# Patient Record
Sex: Male | Born: 1940
Health system: Southern US, Community
[De-identification: ages and names within clinical notes are randomized; demographics above are authoritative.]

## PROBLEM LIST (undated history)

## (undated) DIAGNOSIS — Z5189 Encounter for other specified aftercare: Secondary | ICD-10-CM

## (undated) DIAGNOSIS — I10 Essential (primary) hypertension: Secondary | ICD-10-CM

## (undated) DIAGNOSIS — K922 Gastrointestinal hemorrhage, unspecified: Secondary | ICD-10-CM

## (undated) DIAGNOSIS — IMO0001 Reserved for inherently not codable concepts without codable children: Secondary | ICD-10-CM

## (undated) DIAGNOSIS — J439 Emphysema, unspecified: Secondary | ICD-10-CM

## (undated) DIAGNOSIS — J189 Pneumonia, unspecified organism: Secondary | ICD-10-CM

## (undated) DIAGNOSIS — K5792 Diverticulitis of intestine, part unspecified, without perforation or abscess without bleeding: Secondary | ICD-10-CM

## (undated) DIAGNOSIS — C61 Malignant neoplasm of prostate: Secondary | ICD-10-CM

## (undated) DIAGNOSIS — T4145XA Adverse effect of unspecified anesthetic, initial encounter: Secondary | ICD-10-CM

## (undated) DIAGNOSIS — T8859XA Other complications of anesthesia, initial encounter: Secondary | ICD-10-CM

## (undated) DIAGNOSIS — E785 Hyperlipidemia, unspecified: Secondary | ICD-10-CM

## (undated) DIAGNOSIS — I4891 Unspecified atrial fibrillation: Secondary | ICD-10-CM

## (undated) HISTORY — DX: Reserved for inherently not codable concepts without codable children: IMO0001

## (undated) HISTORY — DX: Encounter for other specified aftercare: Z51.89

## (undated) HISTORY — DX: Gastrointestinal hemorrhage, unspecified: K92.2

## (undated) HISTORY — PX: TESTICLE REMOVAL: SHX68

## (undated) HISTORY — PX: CATARACT EXTRACTION: SUR2

## (undated) HISTORY — PX: OTHER SURGICAL HISTORY: SHX169

## (undated) HISTORY — DX: Unspecified atrial fibrillation: I48.91

## (undated) HISTORY — DX: Malignant neoplasm of prostate: C61

## (undated) HISTORY — DX: Pneumonia, unspecified organism: J18.9

## (undated) HISTORY — DX: Hyperlipidemia, unspecified: E78.5

## (undated) HISTORY — DX: Diverticulitis of intestine, part unspecified, without perforation or abscess without bleeding: K57.92

## (undated) HISTORY — DX: Essential (primary) hypertension: I10

## (undated) HISTORY — DX: Emphysema, unspecified: J43.9

## (undated) HISTORY — PX: PROSTATECTOMY: SHX69

---

## 1974-07-24 HISTORY — PX: TUMOR REMOVAL: SHX12

## 2000-02-14 ENCOUNTER — Ambulatory Visit (HOSPITAL_BASED_OUTPATIENT_CLINIC_OR_DEPARTMENT_OTHER): Admission: RE | Admit: 2000-02-14 | Discharge: 2000-02-14 | Payer: Self-pay | Admitting: General Surgery

## 2000-02-14 ENCOUNTER — Encounter (INDEPENDENT_AMBULATORY_CARE_PROVIDER_SITE_OTHER): Payer: Self-pay | Admitting: *Deleted

## 2001-05-22 ENCOUNTER — Encounter (INDEPENDENT_AMBULATORY_CARE_PROVIDER_SITE_OTHER): Payer: Self-pay | Admitting: *Deleted

## 2001-05-22 ENCOUNTER — Ambulatory Visit (HOSPITAL_COMMUNITY): Admission: RE | Admit: 2001-05-22 | Discharge: 2001-05-22 | Payer: Self-pay | Admitting: *Deleted

## 2001-10-30 ENCOUNTER — Encounter: Payer: Self-pay | Admitting: Urology

## 2001-11-01 ENCOUNTER — Ambulatory Visit (HOSPITAL_COMMUNITY): Admission: RE | Admit: 2001-11-01 | Discharge: 2001-11-01 | Payer: Self-pay | Admitting: Urology

## 2001-11-01 ENCOUNTER — Encounter (INDEPENDENT_AMBULATORY_CARE_PROVIDER_SITE_OTHER): Payer: Self-pay | Admitting: Specialist

## 2002-04-28 ENCOUNTER — Ambulatory Visit (HOSPITAL_BASED_OUTPATIENT_CLINIC_OR_DEPARTMENT_OTHER): Admission: RE | Admit: 2002-04-28 | Discharge: 2002-04-28 | Payer: Self-pay | Admitting: General Surgery

## 2002-04-28 ENCOUNTER — Encounter (INDEPENDENT_AMBULATORY_CARE_PROVIDER_SITE_OTHER): Payer: Self-pay | Admitting: Specialist

## 2002-06-27 ENCOUNTER — Ambulatory Visit (HOSPITAL_BASED_OUTPATIENT_CLINIC_OR_DEPARTMENT_OTHER): Admission: RE | Admit: 2002-06-27 | Discharge: 2002-06-27 | Payer: Self-pay | Admitting: General Surgery

## 2004-05-03 ENCOUNTER — Ambulatory Visit: Admission: RE | Admit: 2004-05-03 | Discharge: 2004-05-03 | Payer: Self-pay | Admitting: Internal Medicine

## 2009-07-19 ENCOUNTER — Emergency Department (HOSPITAL_COMMUNITY): Admission: EM | Admit: 2009-07-19 | Discharge: 2009-07-19 | Payer: Self-pay | Admitting: Emergency Medicine

## 2010-12-09 NOTE — Op Note (Signed)
[facility]  Patient:    IVERSON, SEES Visit Number: [id number] MRN: [id number]          Service Type: DSU Location: DAY Attending Physician:  Trisha Mangle Dictated by:   Veverly Fells Vernie Ammons, M.D. Proc. Date: 11/01/01 Admit Date:  11/01/2001                             Operative Report  PREOPERATIVE DIAGNOSIS:  Painful left spermatocele.  POSTOPERATIVE DIAGNOSIS:  Painful left spermatocele.  PROCEDURE:  Left spermatocelectomy.  SURGEON:  Mark C. Vernie Ammons, M.D.  ANESTHESIA:  Spinal.  ESTIMATED BLOOD LOSS:  Approximately 1 cc.  DRAINS:  None.  SPECIMENS:  Head of left epididymis with spermatocele.  COMPLICATIONS:  None.  INDICATIONS FOR PROCEDURE:  The patient is a 70 year old white male whose had a prior right orchiectomy in the past for benign disease. He presented to my office with a history of painful area in the left hemiscrotum and was found to have a painful swelling in the head of the epididymis. He has had a epididymal cyst denoted previously in this area which was asymptomatic at the time but has become symptomatic. It was noted to be 3 x 4 mm and simple cystic in appearance by ultrasound in September 1992. The area has now become tender but he has had no voiding symptoms nor has he had any fever or erythema in the area. We discussed conservative management versus an epididymectomy and partial epididymectomy and he would like to proceed with removal of this understanding the risks, complications and alternatives.  DESCRIPTION OF PROCEDURE:  After informed consent, the patient was brought to the major O.R., placed on the table, administered spinal anesthesia and then placed in the supine position. His genitalia was then sterilely prepped and draped and a midline median raphe scrotal incision was then made and carried down to open the parietal tunica. A small amount of clear amber fluid was removed and the testicle was then  delivered with the attached epididymis. Inspection revealed the testicle appeared to be normal. There appeared to be an adhesions of the parietal tunica to the testicle itself on the medial aspect. This was incised and the small resultant defect in the testicular tunica was then closed with a running 4-0 Vicryl suture. I was able to palpate the abnormal area in the head of the epididymis. I therefore isolated this and then first placed a 3-0 silk tie around the epididymis in the junction between the upper and middle thirds. It was totally ligated and then divided. I then incised the tissue at the junction between the epididymis and testicle circumferentially. Hemostats were then used to isolate the small vessels and tubules exiting the testicle in the head of the epididymis and these were then tied individually with 3-0 silk ties. This freed the upper portion of the epididymis which was then completely excised and sent to pathology.  I closed the two edges of parietal tunica with running locking 4-0 Vicryl suture. I then replaced the testicle in the tunica and closed that with a running locking 3-0 chromic suture. Prior to completion of the closure, I injected some 0.5% Marcaine around the testicle inside this ______ parietal tunica and closed that completely. I then injected further local anesthetic in the subscrotal tissue and closed the scrotal skin with running 3-0 chromic suture. Neosporin and sterile gauze dressing as well as fluffed 4 x 4s and a scrotal support  were then applied and the patient was taken to the recovery room in stable satisfactory condition. He had no intraoperative complications nor was there any time when he had any episodes of hypotension. He will be observed in the recovery room and discharged when fully recovered. He will be given a prescription for Tylox #40 and follow-up in my office in two weeks. Dictated by:   Veverly Fells Vernie Ammons, M.D. Attending Physician:   Trisha Mangle DD:  11/01/01 TD:  11/02/01 Job: [id number]

## 2010-12-09 NOTE — Procedures (Signed)
[place]  Patient:    Robert Hensley, Robert Hensley Visit Number: [id number] MRN: [id number]          Service Type: END Location: ENDO Attending Physician:  Sabino Gasser Dictated by:   Sabino Gasser, M.D. Proc. Date: 05/22/01 Admit Date:  05/22/2001                             Procedure Report  PROCEDURE PERFORMED:  Colonoscopy.  ENDOSCOPIST:  Sabino Gasser, M.D.  INDICATIONS FOR PROCEDURE:  Rectal bleeding.  ANESTHESIA:  Demerol 80 mg, Versed 8 mg.  DESCRIPTION OF PROCEDURE:  With the patient mildly sedated in the left lateral decubitus position, the Olympus videoscopic colonoscope was inserted in the rectum and passed under direct vision into the cecum.  The cecum was identified by the ileocecal valve and appendiceal orifice, both of which were photographed.  From this point, the colonoscope was slowly withdrawn, taking circumferential views of the entire colonic mucosa, stopping only then in the rectum which appeared normal on direct view and on retroflex view showed a small polyp just above the anal verge which was photographed and removed using hot biopsy forceps technique on a setting of 20/20 blended current.  The endoscope was straightened and withdrawn.  Patients vital signs and pulse oximeter remained stable.  The patient tolerated the procedure well and without apparent complications.  FINDINGS:  Polyp of rectum.  Otherwise unremarkable examination.  PLAN:  Await biopsy report.  Patient will call me for results and follow up with me as an outpatient. Dictated by:   Sabino Gasser, M.D. Attending Physician:  Sabino Gasser DD:  05/22/01 TD:  05/22/01 Job: [id number]

## 2010-12-09 NOTE — Op Note (Signed)
   NAME:  Robert Hensley, Robert Hensley NO.:  [id number]   MEDICAL RECORD NO.:  [id number]                   PATIENT TYPE:  AMB   LOCATION:  DSC                                  FACILITY:  [facility]   PHYSICIAN:  Gita Kudo, M.D.              DATE OF BIRTH:  1940-07-31   DATE OF PROCEDURE:  DATE OF DISCHARGE:                                 OPERATIVE REPORT   OPERATIVE PROCEDURE:  Re-excision, melanoma, chest.   SURGEON:  Dr. Maryagnes Amos.   ANESTHESIA:  MAC - IV sedation, local 1% Xylocaine.   PREOPERATIVE DIAGNOSES:  Melanoma in situ chest, approximately five weeks  post excision.   HISTORY OF PRESENT ILLNESS:  I excised a lesion on Mr. Santoli in November.  The area was melanoma in situ and a very close margin.  The wound has healed  and he comes in for re-excision, to get good margins.   OPERATIVE FINDINGS:  I measured out the operative site and it was  approximately 3 cm.  I then marked out 1 cm in all directions and converted  this into a transversely oriented elliptical incision.  The specimen was  marked with suture for pathology.   OPERATIVE PROCEDURE:  Under satisfactory intravenous sedation, the patient's  chest was prepped and draped in a standard fashion.  The area just to the  right of the sternum was infiltrated with Xylocaine with epinephrine for  good analgesia.  Then, the above described elliptical incision was made and  carried down through the fat, and the specimen dissected way using cautery  for both hemostasis and dissection.  Slight undermining was done in the  central portion of the wound to relieve tension, and then the wound was  closed under some tension with interrupted 3-0 and 2-0 nylon suture.  After  excision, the wound was checked for hemostasis which was good, and a  sterile, absorbant dressing applied.  He tolerated the procedure well, and  went to the recovery room from the operating room in good condition.                             Gita Kudo, M.D.    MRL/MEDQ  D:  06/27/2002  T:  06/27/2002  Job:  [id number]

## 2011-04-05 ENCOUNTER — Ambulatory Visit (HOSPITAL_COMMUNITY)
Admission: RE | Admit: 2011-04-05 | Discharge: 2011-04-05 | Disposition: A | Discharge status: Home / Self Care | Payer: Medicare Other | Source: Ambulatory Visit | Attending: Orthopedic Surgery | Admitting: Orthopedic Surgery

## 2011-04-05 ENCOUNTER — Other Ambulatory Visit (HOSPITAL_COMMUNITY): Payer: Self-pay | Admitting: Orthopedic Surgery

## 2011-04-05 ENCOUNTER — Encounter (HOSPITAL_COMMUNITY)
Admission: RE | Admit: 2011-04-05 | Discharge: 2011-04-05 | Disposition: A | Discharge status: Home / Self Care | Payer: Medicare Other | Source: Ambulatory Visit | Attending: Orthopedic Surgery | Admitting: Orthopedic Surgery

## 2011-04-05 DIAGNOSIS — Z01812 Encounter for preprocedural laboratory examination: Secondary | ICD-10-CM | POA: Insufficient documentation

## 2011-04-05 DIAGNOSIS — Z01818 Encounter for other preprocedural examination: Secondary | ICD-10-CM | POA: Insufficient documentation

## 2011-04-05 DIAGNOSIS — Z01811 Encounter for preprocedural respiratory examination: Secondary | ICD-10-CM

## 2011-04-05 DIAGNOSIS — Z77090 Contact with and (suspected) exposure to asbestos: Secondary | ICD-10-CM | POA: Insufficient documentation

## 2011-04-05 LAB — SURGICAL PCR SCREEN
MRSA, PCR: NEGATIVE
Staphylococcus aureus: NEGATIVE

## 2011-04-05 LAB — BASIC METABOLIC PANEL
CO2: 31 mEq/L (ref 19–32)
Calcium: 10.6 mg/dL — ABNORMAL HIGH (ref 8.4–10.5)
Chloride: 99 mEq/L (ref 96–112)
Potassium: 4.8 mEq/L (ref 3.5–5.1)
Sodium: 138 mEq/L (ref 135–145)

## 2011-04-05 LAB — CBC
HCT: 42.8 % (ref 39.0–52.0)
Hemoglobin: 15.5 g/dL (ref 13.0–17.0)
RBC: 4.96 MIL/uL (ref 4.22–5.81)
RDW: 13.1 % (ref 11.5–15.5)
WBC: 9.4 10*3/uL (ref 4.0–10.5)

## 2011-04-05 LAB — PROTIME-INR: INR: 0.94 (ref 0.00–1.49)

## 2011-04-07 ENCOUNTER — Other Ambulatory Visit: Payer: Self-pay | Admitting: Orthopedic Surgery

## 2011-04-07 DIAGNOSIS — R9389 Abnormal findings on diagnostic imaging of other specified body structures: Secondary | ICD-10-CM

## 2011-04-10 ENCOUNTER — Ambulatory Visit
Admission: RE | Admit: 2011-04-10 | Discharge: 2011-04-10 | Disposition: A | Discharge status: Home / Self Care | Payer: Medicare Other | Source: Ambulatory Visit | Attending: Orthopedic Surgery | Admitting: Orthopedic Surgery

## 2011-04-10 DIAGNOSIS — R9389 Abnormal findings on diagnostic imaging of other specified body structures: Secondary | ICD-10-CM

## 2011-04-10 MED ORDER — IOHEXOL 300 MG/ML  SOLN
75.0000 mL | Freq: Once | INTRAMUSCULAR | Status: AC | PRN
  Administered 2011-04-10: 75 mL via INTRAVENOUS

## 2011-04-18 ENCOUNTER — Other Ambulatory Visit: Payer: Self-pay | Admitting: Orthopedic Surgery

## 2011-04-18 ENCOUNTER — Ambulatory Visit (HOSPITAL_COMMUNITY)
Admission: RE | Admit: 2011-04-18 | Discharge: 2011-04-20 | Disposition: A | Discharge status: Home / Self Care | Payer: Medicare Other | Source: Ambulatory Visit | Attending: Orthopedic Surgery | Admitting: Orthopedic Surgery

## 2011-04-18 DIAGNOSIS — J449 Chronic obstructive pulmonary disease, unspecified: Secondary | ICD-10-CM | POA: Insufficient documentation

## 2011-04-18 DIAGNOSIS — M23305 Other meniscus derangements, unspecified medial meniscus, unspecified knee: Secondary | ICD-10-CM | POA: Insufficient documentation

## 2011-04-18 DIAGNOSIS — E669 Obesity, unspecified: Secondary | ICD-10-CM | POA: Insufficient documentation

## 2011-04-18 DIAGNOSIS — M6740 Ganglion, unspecified site: Secondary | ICD-10-CM | POA: Insufficient documentation

## 2011-04-18 DIAGNOSIS — Z01812 Encounter for preprocedural laboratory examination: Secondary | ICD-10-CM | POA: Insufficient documentation

## 2011-04-18 DIAGNOSIS — I10 Essential (primary) hypertension: Secondary | ICD-10-CM | POA: Insufficient documentation

## 2011-04-18 DIAGNOSIS — M23302 Other meniscus derangements, unspecified lateral meniscus, unspecified knee: Secondary | ICD-10-CM | POA: Insufficient documentation

## 2011-04-18 DIAGNOSIS — J4489 Other specified chronic obstructive pulmonary disease: Secondary | ICD-10-CM | POA: Insufficient documentation

## 2011-04-18 DIAGNOSIS — Z01818 Encounter for other preprocedural examination: Secondary | ICD-10-CM | POA: Insufficient documentation

## 2011-04-18 DIAGNOSIS — Z23 Encounter for immunization: Secondary | ICD-10-CM | POA: Insufficient documentation

## 2011-04-19 LAB — COMPREHENSIVE METABOLIC PANEL
ALT: 16 U/L (ref 0–53)
Albumin: 3.6 g/dL (ref 3.5–5.2)
Alkaline Phosphatase: 70 U/L (ref 39–117)
Alkaline Phosphatase: 74 U/L (ref 39–117)
BUN: 14 mg/dL (ref 6–23)
CO2: 31 mEq/L (ref 19–32)
Chloride: 102 mEq/L (ref 96–112)
GFR calc Af Amer: >60 mL/min (ref >60)
GFR calc Af Amer: >60 mL/min (ref >60)
Glucose, Bld: 121 mg/dL — ABNORMAL HIGH (ref 70–99)
Glucose, Bld: 131 mg/dL — ABNORMAL HIGH (ref 70–99)
Potassium: 4.2 mEq/L (ref 3.5–5.1)
Potassium: 4.3 mEq/L (ref 3.5–5.1)
Sodium: 137 mEq/L (ref 135–145)
Total Bilirubin: 0.5 mg/dL (ref 0.3–1.2)
Total Protein: 6.9 g/dL (ref 6.0–8.3)

## 2011-04-19 LAB — CARDIAC PANEL(CRET KIN+CKTOT+MB+TROPI)
Relative Index: INVALID (ref 0.0–2.5)
Relative Index: INVALID (ref 0.0–2.5)

## 2011-04-19 LAB — CBC
HCT: 39.8 % (ref 39.0–52.0)
Hemoglobin: 13.6 g/dL (ref 13.0–17.0)
Hemoglobin: 14.5 g/dL (ref 13.0–17.0)
MCHC: 34.8 g/dL (ref 30.0–36.0)
WBC: 12.1 10*3/uL — ABNORMAL HIGH (ref 4.0–10.5)
WBC: 15.8 10*3/uL — ABNORMAL HIGH (ref 4.0–10.5)

## 2011-04-19 LAB — TROPONIN I: Troponin I: <0.3 ng/mL (ref <0.30)

## 2011-04-20 LAB — CARDIAC PANEL(CRET KIN+CKTOT+MB+TROPI)
CK, MB: 2.2 ng/mL (ref 0.3–4.0)
Relative Index: INVALID (ref 0.0–2.5)
Troponin I: <0.3 ng/mL (ref <0.30)
Troponin I: <0.3 ng/mL (ref <0.30)

## 2011-04-24 NOTE — Consult Note (Signed)
NAME:  Robert Hensley, Robert Hensley NO.:  [id number]  MEDICAL RECORD NO.:  [id number]  LOCATION:  5008                         FACILITY:  [facility]  PHYSICIAN:  Pleas Koch, MD        DATE OF BIRTH:  [birth year]  DATE OF CONSULTATION: DATE OF DISCHARGE:                                CONSULTATION   REFERRING PHYSICIAN:  Burnard Bunting, MD  REASON FOR CONSULTATION:  Syncope.  HISTORY OF PRESENT ILLNESS:  Mr. Robert Hensley is a very pleasant Caucasian male with history mainly of hypertension who presented to [facility] for right knee lateral meniscus tear and cyst that was repaired by diagnostic arthroscopy yesterday by Dr. August Saucer.  He was getting up today at bedside with the sister and went near the door and sat on the side of the table when he felt he was about to pass out.  He denies any blurred vision or double vision prior, but did feel a sensation of presyncope and sensation of dizziness and felt that he was about to fall.  He thought himself quickly that he should get back to the bed, however, was not able to make it and asked the sister who is in the room and observe this to get a glass of cold water.  Subsequently, he got to the ground in a half fall onto his left side.  Please note that his arthroscopy was on the right knee.  The patient was unresponsive for a couple of seconds and blood pressures that were done initially when Rapid Response was called showed systolic blood pressure of 90/40 with a heart rate of 45 and blood pressure recycle showing blood pressure of 103/37, pulse rate of 55, subsequent blood pressure 115/60 and heart rate 61.  The patient was sat up from the floor but became diaphoretic and his blood pressure was 125/64 and 63.  His blood pressure subsequently came to the normal range.  He was given 1/2 liter bolus of normal saline.  When I got to the bedside, he was kept on telemetry and his heart rate was in the 60s, normal sinus rhythm on  lead II of the monitor.  He was fully oriented and knew that he had a fall, he knew where he was.  He denied any specific chest pain, any real shortness of breath at that time.  He also denied any specific headache or any tingling in his arms or any urinary or bowel incontinence at that time or any seizure-like activity.  The patient then sat up and the patient is actually fully oriented right now and is able to talk to me without any slurred speech or any obvious focal deficit.  The patient moves all four limbs equally and has no real weakness in any of his limbs per my cursory exam initially, his sister and him given the report.  The patient states that he has had this type of issue in the past when he had a prior surgery.  He actually did have a vasovagal syncope after the operation as he got queasy and has a weak constitution per him.  The patient also thinks that his Maxzide may  play a role in his having this vasovagal/orthostatic event given the fact that he seems to have this every time he takes his Maxzide.  The patient admits to taking all of his chronic blood pressure medications religiously and has not had much problem except with may be a little bit of the Maxzide that he is on.  PAST MEDICAL HISTORY:  He carries a diagnosis of hypertension which is relatively well controlled, hyperlipidemia.  He is relatively obese with a BMI of above 30.  Please note that the patient also took Percocet this morning and was nonambulatory after operation to 5-6 o'clock this morning and then got up once again which is a period of time where he had the syncope.  The patient also carries a history of GI bleed per colonoscopy report on May 22, 2001, and was told never to take aspirin again.  The path report from those findings show benign colonic glands with serrated architecture consistent with hyperplastic polyp and no evidence of malignancy.  He has also had benign spermatocele tail  of epididymis.  He has had dysplastic nevi and a history of malignant melanoma in situ, numerous times operated upon in October and December 2003.  FAMILY HISTORY:  He has relatives who had strokes in their old age.  He has no one who has had any real heart attacks, however.  PHYSICAL EXAMINATION:  GENERAL:  The patient is pleasant, alert, oriented Caucasian male lying in bed.  He is able to track my finger equally in all four quadrants. HEENT:  Vision by direct confrontation reveals all quadrants are intact. NECK:  Soft, supple.  He has no carotid bruit. HEART:  He has a faint murmur at left lower sternal edge. LUNGS:  His chest is completely clinically clear, although he tells me he has had a history of emphysema in the past. ABDOMEN:  Soft, nontender, nondistended, and obese and he has a midline lower abdominal scar. EXTREMITIES:  Soft, nontender.  His right knee is wrapped and has one or two spots of blood on it. NEUROLOGIC:  He has 5/5 power and is very strong in his flexors and extensors of his biceps, triceps, and of his arms.  He has grade 5/5 power of his left hip.  His right knee has mild limitation of power secondary to habitus.  His reflexes are intact bilaterally and he has 2 or 3 reflexes.  Sensation is grossly intact.  His gait was assessed. The patient was able to stand and ambulate without real event.  LABORATORY DATA:  Orthostatic vital signs that were done revealed sitting blood pressure 158/71 with pulse of 60, lying blood pressure 164/60 with pulse 58, and standing blood pressure 137/67 with pulse of 71.  Again, on tele he is 66 beats per minute.  An EKG was done which showed left anterior fascicular block.  PR interval of 0.08.  R-wave progression V3 through V4.  I do not appreciate any Q waves.  There is no gross T-wave inversion and this is very poor quality EKG.  Labs are pending that we have ordered in room were CBC with diff, CMET, and cardiac  panel.  IMPRESSION/ASSESSMENT:  This is a 70 year old male with likely vasovagal syncope versus orthostatic hypotension, likely related to him having high vagal tone but also could be due to multiple blood pressure medications that he is on.  At present time, I would recommend cutting back his dose of metoprolol succinate to 12.5 daily given this is a nodal agent  and continuing him on his Maxzide and cutting his amlodipine back to 5 mg and as also has AV nodal properties.  If the patient does not have further recurrence of the same, I feel it would be reasonable to hold off on carotid ultrasound and a CT scan of the head as I do not think he had a stroke or a seizure as he is totally nonfocal.  With regards to further protection in the near future, I would recommend that he start on aspirin 81 mg daily with PPI coverage despite his prior history of colonic bleed simply given the fact that he has metabolic syndrome by criterion of weight and BMI the fact that he is hypertensive and he has hyperlipidemia and this will prevent all caused mortality from stroke or myocardial infarction.  Thank you for this interesting consult.  We await further labs.  If his troponins which we have ordered come back positive, we will obviously consult Cardiology.  Otherwise if the patient is totally asymptomatic in the morning and his labs are normal, he can safely be discharged home from a medicine standpoint.  I appreciate Dr. Lorin Picket Dean's referral of this patient -- if need be please call as but we will sign off if his labs and other workup are normal.          ______________________________ Pleas Koch, MD     JS/MEDQ  D:  04/19/2011  T:  04/19/2011  Job:  [id number]  Electronically Signed by Pleas Koch MD on 04/24/2011 04:25:49 PM

## 2011-05-03 NOTE — Op Note (Signed)
  NAME:  Robert Hensley, PIZZO NO.:  [id number]  MEDICAL RECORD NO.:  [id number]  LOCATION:  SDSC                         FACILITY:  [facility]  PHYSICIAN:  Burnard Bunting, M.D.    DATE OF BIRTH:  01-31-1941  DATE OF PROCEDURE:  04/18/2011 DATE OF DISCHARGE:                              OPERATIVE REPORT   PREOPERATIVE DIAGNOSIS:  Right knee lateral meniscal tear and cyst.  POSTOPERATIVE DIAGNOSIS:  Right knee lateral meniscal tear, medial meniscal tear, and lateral meniscal cyst.  PROCEDURE:  Right knee diagnostic arthroscopy, partial medial lateral meniscectomy, open excision of meniscal cyst.  SURGEON:  Burnard Bunting, M.D.  ASSISTANT:  None.  ANESTHESIA:  Spinal.  INDICATIONS:  [name] is a 70 year old patient with right knee pain.  MRI is consistent with lateral meniscal tear and a meniscal cyst presents now for operative management.  PROCEDURE IN DETAIL:  The patient was brought to operating room where spinal anesthetic was induced.  Right leg time-out was called.  Right knee area was __________with alcohol and Betadine and dry prepped DuraPrep solution and draped in sterile manner.  After anesthesia was covered the operative field, Tourniquet was not used, anterior inferolateral portals established.  Anterior-inferior medial forceps established under direct visualization, diagnostic arthroscopy was performed.  The patient had intact patellofemoral compartment.  On the medial side, it did have a degenerative radial tear of the medial meniscus involving 30% anterior-posterior width, this was debrided back to stable rim.  He did have some early grade 1 chondromalacia changes on the medial femoral condyle.  ACL, PCL intact.  The patient had grade 3 changes on the lateral tibial plateau and lateral femoral condyle with a pair of the anterior horn lateral meniscus, unstable flaps, horizontal cleavage type, this was debrided back to stable rims with a combination of  basket punch and shavers.  About 50% to 60% anterior-posterior width of the meniscus was involved.  Following meniscal debridement, knee joint was thoroughly irrigated.  Instruments were removed.  Then, incision was then made over the meniscal cyst which was anterior- lateral.  Following this, a longitudinal incision was made over the cyst.  Skin tissues were sharply divided.  Iliotibial band was partially divided.  Cyst was visualized filled with the gelatinous fluid, it was excised off the lateral meniscal rim.  The inferior genicular artery was encountered and coagulated.  The cyst was excised and sent to pathology. The cyst stalk was cauterized.  At this time, the incision was thoroughly irrigated and closed using interrupted inverted 0 Vicryl suture 2-0, Vicryl suture and 3-0 Prolene.  Steri-Strips were applied.  Portals were closed using 3-0 nylon.  Solution of Marcaine, morphine finally injected to the knee.  The patient tolerated the procedure well without immediate complications. __________.  Bulky dressing and knee immobilizer was applied.     Burnard Bunting, M.D.     GSD/MEDQ  D:  04/18/2011  T:  04/18/2011  Job:  [id number]  Electronically Signed by Reece Agar.  Marion Rosenberry M.D. on 05/03/2011 04:54:09 PM

## 2011-07-26 ENCOUNTER — Other Ambulatory Visit: Payer: Self-pay | Admitting: Internal Medicine

## 2011-07-26 DIAGNOSIS — J948 Other specified pleural conditions: Secondary | ICD-10-CM

## 2011-09-18 ENCOUNTER — Ambulatory Visit
Admission: RE | Admit: 2011-09-18 | Discharge: 2011-09-18 | Disposition: A | Discharge status: Home / Self Care | Payer: Medicare Other | Source: Ambulatory Visit | Attending: Internal Medicine | Admitting: Internal Medicine

## 2011-09-18 DIAGNOSIS — J948 Other specified pleural conditions: Secondary | ICD-10-CM

## 2011-09-18 MED ORDER — IOHEXOL 300 MG/ML  SOLN
75.0000 mL | Freq: Once | INTRAMUSCULAR | Status: AC | PRN
  Administered 2011-09-18: 75 mL via INTRAVENOUS

## 2011-10-26 ENCOUNTER — Encounter: Payer: Self-pay | Admitting: Internal Medicine

## 2011-10-31 ENCOUNTER — Encounter: Payer: Self-pay | Admitting: Internal Medicine

## 2011-11-01 ENCOUNTER — Encounter: Payer: Self-pay | Admitting: Internal Medicine

## 2011-11-01 ENCOUNTER — Ambulatory Visit (INDEPENDENT_AMBULATORY_CARE_PROVIDER_SITE_OTHER): Payer: Medicare Other | Admitting: Internal Medicine

## 2011-11-01 ENCOUNTER — Other Ambulatory Visit (INDEPENDENT_AMBULATORY_CARE_PROVIDER_SITE_OTHER): Payer: Medicare Other

## 2011-11-01 VITALS — BP 148/64 | HR 80 | Ht 74.0 in | Wt 256.0 lb

## 2011-11-01 DIAGNOSIS — Z1211 Encounter for screening for malignant neoplasm of colon: Secondary | ICD-10-CM

## 2011-11-01 DIAGNOSIS — R1031 Right lower quadrant pain: Secondary | ICD-10-CM

## 2011-11-01 DIAGNOSIS — E7800 Pure hypercholesterolemia, unspecified: Secondary | ICD-10-CM | POA: Insufficient documentation

## 2011-11-01 DIAGNOSIS — I10 Essential (primary) hypertension: Secondary | ICD-10-CM | POA: Insufficient documentation

## 2011-11-01 DIAGNOSIS — J449 Chronic obstructive pulmonary disease, unspecified: Secondary | ICD-10-CM | POA: Insufficient documentation

## 2011-11-01 LAB — CBC WITH DIFFERENTIAL/PLATELET
Basophils Absolute: 0 10*3/uL (ref 0.0–0.1)
Eosinophils Absolute: 0.1 10*3/uL (ref 0.0–0.7)
HCT: 42.8 % (ref 39.0–52.0)
Hemoglobin: 14.6 g/dL (ref 13.0–17.0)
Lymphocytes Relative: 21.9 % (ref 12.0–46.0)
Lymphs Abs: 1.9 10*3/uL (ref 0.7–4.0)
MCHC: 34.1 g/dL (ref 30.0–36.0)
Monocytes Absolute: 0.5 10*3/uL (ref 0.1–1.0)
Neutro Abs: 6 10*3/uL (ref 1.4–7.7)
RDW: 13.8 % (ref 11.5–14.6)

## 2011-11-01 LAB — COMPREHENSIVE METABOLIC PANEL
ALT: 43 U/L (ref 0–53)
AST: 31 U/L (ref 0–37)
Alkaline Phosphatase: 68 U/L (ref 39–117)
Creatinine, Ser: 0.8 mg/dL (ref 0.4–1.5)
Total Bilirubin: 0.5 mg/dL (ref 0.3–1.2)

## 2011-11-01 MED ORDER — PEG-KCL-NACL-NASULF-NA ASC-C 100 G PO SOLR: 1.0000 | Freq: Once | ORAL | Status: DC

## 2011-11-01 NOTE — Patient Instructions (Addendum)
You have been scheduled for a colonoscopy with propofol. Please follow written instructions given to you at your visit today.  Please pick up your prep kit at the pharmacy within the next 1-3 days.  You have been scheduled for a CT on 11/03/2011 at 10:30, @ [facility].   Please drink one bottle of your contrast at 8:30am The second bottle of contrast at 9:30am   Your physician has requested that you go to the basement for  lab work before leaving today.

## 2011-11-01 NOTE — Progress Notes (Signed)
Subjective:    Patient ID: Robert Hensley, male    DOB: June 12, 1941, 71 y.o.   MRN: [id number]  HPI Robert Hensley is a 71 year old male with a past medical history of hypertension, hyperlipidemia, COPD who is seen in consultation at the request of Dr. Renne Crigler for evaluation of right lower quadrant abdominal pain. The patient states he's had goal and somewhat intermittent right lower corner abdominal pain since January 2013. Now this pain is rather constant, and he cannot relate this pain to anything specific. He does not seem to get better or worse with eating, bowel movement, movement. He reports being able to work outside yesterday on his lawn and garden without additional pain. His bowel habits have been a bit more loose for him over the last few months, he said no blood or melena. He rates abdominal pain 3-4/10 at worst, and now about a 1/10.  He was treated recently with metronidazole for 7 days, and at the end of 7 days ciprofloxacin was added with metronidazole for additional week. He completed his therapy for 3 days ago. Initially he reports significant improvement in his abdominal pain, but it did not completely resolved with antibiotics. He is noted no fevers or chills. Appetite is good. No weight loss. No other abdominal pain, specifically no epigastric pain. No heartburn. No nausea or vomiting. No dysphagia or odynophagia.   Review of Systems As per HPI, otherwise neg  Past Medical History  Diagnosis Date  . Prostate cancer   . Emphysematous COPD   . Hyperlipidemia   . Hypertension   . Pneumonia   . Diverticulitis    Current Outpatient Prescriptions  Medication Sig Dispense Refill  . amLODipine-benazepril (LOTREL) 10-40 MG per capsule Take 1 capsule by mouth daily.      . Ascorbic Acid (VITAMIN C) 100 MG tablet Take 100 mg by mouth daily.      Marland Kitchen atorvastatin (LIPITOR) 20 MG tablet Take 20 mg by mouth daily.      . diphenhydrAMINE (BENADRYL) 25 mg capsule Take 25 mg by mouth as needed.       . Melatonin 5 MG CAPS Take 1 capsule by mouth as needed.      . metoprolol tartrate (LOPRESSOR) 25 MG tablet Take 25 mg by mouth daily.      . Multiple Vitamin (MULTIVITAMIN) tablet Take 1 tablet by mouth daily.      . Omega-3 Fatty Acids (FISH OIL) 1200 MG CAPS Take 1 capsule by mouth daily.      Marland Kitchen triamterene-hydrochlorothiazide (DYAZIDE) 37.5-25 MG per capsule Take 1 capsule by mouth daily.      . peg 3350 powder (MOVIPREP) SOLR Take 1 kit (100 g total) by mouth once.  1 kit  0   Allergies  Allergen Reactions  . Penicillins    Family History  Problem Relation Age of Onset  . Liver cancer Sister    History  Substance Use Topics  . Smoking status: Former Smoker    Quit date: 09/22/1974  . Smokeless tobacco: Former Neurosurgeon    Types: Chew    Quit date: 07/24/1994  . Alcohol Use: No        Objective:   Physical Exam BP 148/64  Pulse 80  Ht 6\' 2"  (1.88 m)  Wt 256 lb (116.121 kg)  BMI 32.87 kg/m2 Constitutional: Well-developed and well-nourished. No distress. HEENT: Normocephalic and atraumatic. Oropharynx is clear and moist. No oropharyngeal exudate. Conjunctivae are normal. Pupils are equal round and reactive to light. No  scleral icterus. Neck: Neck supple. Trachea midline. Cardiovascular: Normal rate, regular rhythm and intact distal pulses. No M/R/G Pulmonary/chest: Effort normal and breath sounds normal. No wheezing, rales or rhonchi. Abdominal: Soft, very mild right lower quadrant abdominal pain to deep palpation without rebound or guarding, nondistended. Bowel sounds active throughout. There are no masses palpable. No hepatosplenomegaly. Extremities: no clubbing, cyanosis, or edema Lymphadenopathy: No cervical adenopathy noted. Neurological: Alert and oriented to person place and time. Skin: Skin is warm and dry. No rashes noted. Psychiatric: Normal mood and affect. Behavior is normal.  CBC    Component Value Date/Time   WBC 8.5 11/01/2011 1126   RBC 4.80 11/01/2011  1126   HGB 14.6 11/01/2011 1126   HCT 42.8 11/01/2011 1126   PLT 263.0 11/01/2011 1126   MCV 89.1 11/01/2011 1126   MCH 31.5 04/19/2011 1902   MCHC 34.1 11/01/2011 1126   RDW 13.8 11/01/2011 1126   LYMPHSABS 1.9 11/01/2011 1126   MONOABS 0.5 11/01/2011 1126   EOSABS 0.1 11/01/2011 1126   BASOSABS 0.0 11/01/2011 1126   CMP     Component Value Date/Time   NA 137 11/01/2011 1126   K 4.7 11/01/2011 1126   CL 100 11/01/2011 1126   CO2 29 11/01/2011 1126   GLUCOSE 105* 11/01/2011 1126   BUN 14 11/01/2011 1126   CREATININE 0.8 11/01/2011 1126   CALCIUM 9.7 11/01/2011 1126   PROT 8.1 11/01/2011 1126   ALBUMIN 4.6 11/01/2011 1126   AST 31 11/01/2011 1126   ALT 43 11/01/2011 1126   ALKPHOS 68 11/01/2011 1126   BILITOT 0.5 11/01/2011 1126   GFRNONAA >60 04/19/2011 1902   GFRAA >60 04/19/2011 1902       Assessment & Plan:  71 year old male with a past medical history of hypertension, hyperlipidemia, COPD who is seen in consultation at the request of Dr. Renne Crigler for evaluation of right lower quadrant abdominal pain.  1. RLQ abd pain -- the patient's right lower corner pain did seem to respond, though incompletely to oral antibiotics. This raises the possibility of diverticulitis. Given that it did not resolve completely with what would be expected to be adequate treatment, I recommend an abdominal CT scan. If this is unremarkable, then I recommended colonoscopy. We discussed the risks and benefits of colonoscopy, and he is agreeable to proceed. He does report having had a previous colonoscopy but greater than 10 years ago.  Further recommendations after imaging and procedure

## 2011-11-03 ENCOUNTER — Other Ambulatory Visit (HOSPITAL_COMMUNITY): Payer: Medicare Other

## 2011-11-03 ENCOUNTER — Ambulatory Visit (HOSPITAL_COMMUNITY)
Admission: RE | Admit: 2011-11-03 | Discharge: 2011-11-03 | Disposition: A | Discharge status: Home / Self Care | Payer: Medicare Other | Source: Ambulatory Visit | Attending: Internal Medicine | Admitting: Internal Medicine

## 2011-11-03 DIAGNOSIS — R1031 Right lower quadrant pain: Secondary | ICD-10-CM | POA: Insufficient documentation

## 2011-11-03 DIAGNOSIS — Z1211 Encounter for screening for malignant neoplasm of colon: Secondary | ICD-10-CM

## 2011-11-03 DIAGNOSIS — Z8546 Personal history of malignant neoplasm of prostate: Secondary | ICD-10-CM | POA: Insufficient documentation

## 2011-11-03 MED ORDER — IOHEXOL 300 MG/ML  SOLN
100.0000 mL | Freq: Once | INTRAMUSCULAR | Status: AC | PRN
  Administered 2011-11-03: 100 mL via INTRAVENOUS

## 2011-11-06 ENCOUNTER — Ambulatory Visit (AMBULATORY_SURGERY_CENTER): Payer: Medicare Other | Admitting: Internal Medicine

## 2011-11-06 ENCOUNTER — Encounter: Payer: Self-pay | Admitting: Internal Medicine

## 2011-11-06 VITALS — BP 160/73 | HR 84 | Temp 98.1°F | Resp 17 | Ht 74.0 in | Wt 256.0 lb

## 2011-11-06 DIAGNOSIS — D126 Benign neoplasm of colon, unspecified: Secondary | ICD-10-CM

## 2011-11-06 DIAGNOSIS — R1031 Right lower quadrant pain: Secondary | ICD-10-CM

## 2011-11-06 DIAGNOSIS — Z1211 Encounter for screening for malignant neoplasm of colon: Secondary | ICD-10-CM

## 2011-11-06 MED ORDER — SODIUM CHLORIDE 0.9 % IV SOLN: 500.0000 mL | INTRAVENOUS | Status: DC

## 2011-11-06 NOTE — Op Note (Signed)
[facility] [address] [place], [place]  [address]  COLONOSCOPY PROCEDURE REPORT  PATIENT:  Robert Hensley, Robert Hensley  MR#:  [id number] BIRTHDATE:  [birth year], 70 yrs. old  GENDER:  male ENDOSCOPIST:  Carie Caddy. Bellamy Rubey, MD REF. BY:  Soyla Murphy. Renne Crigler, M.D. PROCEDURE DATE:  11/06/2011 PROCEDURE:  Colonoscopy with snare polypectomy ASA CLASS:  Class III INDICATIONS:  RLQ abdominal pain (s/p empiric treatment for diverticulitis), Routine Risk Screening MEDICATIONS:   MAC sedation, administered by CRNA, propofol (Diprivan) 350 mg IV  DESCRIPTION OF PROCEDURE:   After the risks benefits and alternatives of the procedure were thoroughly explained, informed consent was obtained.  Digital rectal exam was performed and revealed no rectal masses.   The LB CF-[id number] endoscope was introduced through the anus and advanced to the terminal ileum which was intubated for a short distance, without limitations. The quality of the prep was good, using MoviPrep.  The instrument was then slowly withdrawn as the colon was fully examined. <<PROCEDUREIMAGES>>  FINDINGS:  The terminal ileum appeared normal.  A 3 mm sessile polyp was found in the ascending colon. Polyp was snared without cautery. Retrieval was successful.   Mild diverticulosis was found in the left colon.   Retroflexed views in the rectum revealed internal hemorrhoids.  Otherwise an unremarkable examination of the colon. The scope was then withdrawn  from the cecum and the procedure completed.  COMPLICATIONS:  None ENDOSCOPIC IMPRESSION: 1) Normal terminal ileum 2) Small sessile polyp in the ascending colon. Removed and sent to pathology. 3) Mild diverticulosis in the left colon 4) Internal hemorrhoids  RECOMMENDATIONS: 1) Hold aspirin, aspirin products, and anti-inflammatory medication for 1 week. 2) Await pathology results 3) High fiber diet. 4) If the polyp removed today is proven to be an adenomatous (pre-cancerous)  polyps, you will need a repeat colonoscopy in 5 years. Otherwise you should continue to follow colorectal cancer screening guidelines for "routine risk" patients with colonoscopy in 10 years. You will receive a letter within 1-2 weeks with the results of your biopsy as well as final recommendations. Please call my office if you have not received a letter after 3 weeks.  Carie Caddy. Rhea Belton, MD  CC:  Romero Liner, MD The Patient  n. eSIGNEDCarie Caddy. Haylen Shelnutt at 11/06/2011 09:34 AM  Henry Russel, [phone]

## 2011-11-06 NOTE — Patient Instructions (Signed)
YOU HAD AN ENDOSCOPIC PROCEDURE TODAY AT THE [facility]: Refer to the procedure report that was given to you for any specific questions about what was found during the examination.  If the procedure report does not answer your questions, please call your gastroenterologist to clarify.  If you requested that your care partner not be given the details of your procedure findings, then the procedure report has been included in a sealed envelope for you to review at your convenience later.  YOU SHOULD EXPECT: Some feelings of bloating in the abdomen. Passage of more gas than usual.  Walking can help get rid of the air that was put into your GI tract during the procedure and reduce the bloating. If you had a lower endoscopy (such as a colonoscopy or flexible sigmoidoscopy) you may notice spotting of blood in your stool or on the toilet paper. If you underwent a bowel prep for your procedure, then you may not have a normal bowel movement for a few days.  DIET: Your first meal following the procedure should be a light meal and then it is ok to progress to your normal diet.  A half-sandwich or bowl of soup is an example of a good first meal.  Heavy or fried foods are harder to digest and may make you feel nauseous or bloated.  Likewise meals heavy in dairy and vegetables can cause extra gas to form and this can also increase the bloating.  Drink plenty of fluids but you should avoid alcoholic beverages for 24 hours.  ACTIVITY: Your care partner should take you home directly after the procedure.  You should plan to take it easy, moving slowly for the rest of the day.  You can resume normal activity the day after the procedure however you should NOT DRIVE or use heavy machinery for 24 hours (because of the sedation medicines used during the test).    SYMPTOMS TO REPORT IMMEDIATELY: A gastroenterologist can be reached at any hour.  During normal business hours, 8:30 AM to 5:00 PM Monday through Friday,  call [phone].  After hours and on weekends, please call the GI answering service at [phone] who will take a message and have the physician on call contact you.   Following lower endoscopy (colonoscopy or flexible sigmoidoscopy):  Excessive amounts of blood in the stool  Significant tenderness or worsening of abdominal pains  Swelling of the abdomen that is new, acute  Fever of 100F or higher    FOLLOW UP: If any biopsies were taken you will be contacted by phone or by letter within the next 1-3 weeks.  Call your gastroenterologist if you have not heard about the biopsies in 3 weeks.  Our staff will call the home number listed on your records the next business day following your procedure to check on you and address any questions or concerns that you may have at that time regarding the information given to you following your procedure. This is a courtesy call and so if there is no answer at the home number and we have not heard from you through the emergency physician on call, we will assume that you have returned to your regular daily activities without incident.  SIGNATURES/CONFIDENTIALITY: You and/or your care partner have signed paperwork which will be entered into your electronic medical record.  These signatures attest to the fact that that the information above on your After Visit Summary has been reviewed and is understood.  Full responsibility of the confidentiality   of this discharge information lies with you and/or your care-partner.     NO ASPIRIN OR ANTI INFLAMMATORY MEDICATIONS FOR ONE WEEK  INFORMATION ON POLYPS, DIVERTICULOSIS, HEMORRHOIDS, & HIGH FIBER DIET GIVEN TO YOU TODAY

## 2011-11-06 NOTE — Progress Notes (Signed)
Patient did not experience any of the following events: a burn prior to discharge; a fall within the facility; wrong site/side/patient/procedure/implant event; or a hospital transfer or hospital admission upon discharge from the facility. (G8907) Patient did not have preoperative order for IV antibiotic SSI prophylaxis. (G8918)  

## 2011-11-07 ENCOUNTER — Telehealth: Payer: Self-pay | Admitting: *Deleted

## 2011-11-07 NOTE — Telephone Encounter (Signed)
  Follow up Call-  Call back number 11/06/2011  Post procedure Call Back phone  # [phone]  Permission to leave phone message Yes     Patient questions:  Message left to call if necessary.

## 2011-11-14 ENCOUNTER — Encounter: Payer: Self-pay | Admitting: Internal Medicine

## 2011-12-29 ENCOUNTER — Ambulatory Visit: Payer: Medicare Other | Admitting: Internal Medicine

## 2015-04-20 DIAGNOSIS — Z0000 Encounter for general adult medical examination without abnormal findings: Secondary | ICD-10-CM | POA: Diagnosis not present

## 2015-04-20 DIAGNOSIS — Z23 Encounter for immunization: Secondary | ICD-10-CM | POA: Diagnosis not present

## 2015-04-20 DIAGNOSIS — E780 Pure hypercholesterolemia: Secondary | ICD-10-CM | POA: Diagnosis not present

## 2015-04-20 DIAGNOSIS — Z125 Encounter for screening for malignant neoplasm of prostate: Secondary | ICD-10-CM | POA: Diagnosis not present

## 2015-04-20 DIAGNOSIS — I10 Essential (primary) hypertension: Secondary | ICD-10-CM | POA: Diagnosis not present

## 2015-04-27 DIAGNOSIS — Z0000 Encounter for general adult medical examination without abnormal findings: Secondary | ICD-10-CM | POA: Diagnosis not present

## 2015-04-27 DIAGNOSIS — C61 Malignant neoplasm of prostate: Secondary | ICD-10-CM | POA: Diagnosis not present

## 2015-04-27 DIAGNOSIS — J449 Chronic obstructive pulmonary disease, unspecified: Secondary | ICD-10-CM | POA: Diagnosis not present

## 2015-04-27 DIAGNOSIS — I10 Essential (primary) hypertension: Secondary | ICD-10-CM | POA: Diagnosis not present

## 2015-04-27 DIAGNOSIS — Z1212 Encounter for screening for malignant neoplasm of rectum: Secondary | ICD-10-CM | POA: Diagnosis not present

## 2015-10-05 DIAGNOSIS — H40033 Anatomical narrow angle, bilateral: Secondary | ICD-10-CM | POA: Diagnosis not present

## 2015-10-05 DIAGNOSIS — H1013 Acute atopic conjunctivitis, bilateral: Secondary | ICD-10-CM | POA: Diagnosis not present

## 2015-10-11 ENCOUNTER — Encounter (INDEPENDENT_AMBULATORY_CARE_PROVIDER_SITE_OTHER): Payer: Medicare Other | Admitting: Ophthalmology

## 2015-10-11 DIAGNOSIS — H43813 Vitreous degeneration, bilateral: Secondary | ICD-10-CM | POA: Diagnosis not present

## 2015-10-11 DIAGNOSIS — H35033 Hypertensive retinopathy, bilateral: Secondary | ICD-10-CM | POA: Diagnosis not present

## 2015-10-11 DIAGNOSIS — H353121 Nonexudative age-related macular degeneration, left eye, early dry stage: Secondary | ICD-10-CM | POA: Diagnosis not present

## 2015-10-11 DIAGNOSIS — I10 Essential (primary) hypertension: Secondary | ICD-10-CM | POA: Diagnosis not present

## 2015-10-11 DIAGNOSIS — H2513 Age-related nuclear cataract, bilateral: Secondary | ICD-10-CM

## 2015-10-11 DIAGNOSIS — H353114 Nonexudative age-related macular degeneration, right eye, advanced atrophic with subfoveal involvement: Secondary | ICD-10-CM

## 2016-04-27 DIAGNOSIS — I10 Essential (primary) hypertension: Secondary | ICD-10-CM | POA: Diagnosis not present

## 2016-04-27 DIAGNOSIS — Z23 Encounter for immunization: Secondary | ICD-10-CM | POA: Diagnosis not present

## 2016-04-27 DIAGNOSIS — Z125 Encounter for screening for malignant neoplasm of prostate: Secondary | ICD-10-CM | POA: Diagnosis not present

## 2016-04-27 DIAGNOSIS — Z0000 Encounter for general adult medical examination without abnormal findings: Secondary | ICD-10-CM | POA: Diagnosis not present

## 2016-04-27 DIAGNOSIS — E7800 Pure hypercholesterolemia, unspecified: Secondary | ICD-10-CM | POA: Diagnosis not present

## 2016-05-04 DIAGNOSIS — Z0000 Encounter for general adult medical examination without abnormal findings: Secondary | ICD-10-CM | POA: Diagnosis not present

## 2016-05-04 DIAGNOSIS — E7800 Pure hypercholesterolemia, unspecified: Secondary | ICD-10-CM | POA: Diagnosis not present

## 2016-05-16 DIAGNOSIS — I10 Essential (primary) hypertension: Secondary | ICD-10-CM | POA: Diagnosis not present

## 2016-08-14 ENCOUNTER — Encounter: Payer: Self-pay | Admitting: *Deleted

## 2016-08-21 DIAGNOSIS — R300 Dysuria: Secondary | ICD-10-CM | POA: Diagnosis not present

## 2016-08-21 DIAGNOSIS — C61 Malignant neoplasm of prostate: Secondary | ICD-10-CM | POA: Diagnosis not present

## 2016-08-28 ENCOUNTER — Encounter: Payer: Self-pay | Admitting: Internal Medicine

## 2016-09-21 DIAGNOSIS — L570 Actinic keratosis: Secondary | ICD-10-CM | POA: Diagnosis not present

## 2016-09-21 DIAGNOSIS — L219 Seborrheic dermatitis, unspecified: Secondary | ICD-10-CM | POA: Diagnosis not present

## 2016-09-21 DIAGNOSIS — L814 Other melanin hyperpigmentation: Secondary | ICD-10-CM | POA: Diagnosis not present

## 2016-09-21 DIAGNOSIS — D225 Melanocytic nevi of trunk: Secondary | ICD-10-CM | POA: Diagnosis not present

## 2016-09-21 DIAGNOSIS — L821 Other seborrheic keratosis: Secondary | ICD-10-CM | POA: Diagnosis not present

## 2016-10-03 DIAGNOSIS — H2513 Age-related nuclear cataract, bilateral: Secondary | ICD-10-CM | POA: Diagnosis not present

## 2016-10-03 DIAGNOSIS — H40033 Anatomical narrow angle, bilateral: Secondary | ICD-10-CM | POA: Diagnosis not present

## 2017-02-05 DIAGNOSIS — L0291 Cutaneous abscess, unspecified: Secondary | ICD-10-CM | POA: Diagnosis not present

## 2017-05-03 DIAGNOSIS — E7800 Pure hypercholesterolemia, unspecified: Secondary | ICD-10-CM | POA: Diagnosis not present

## 2017-05-03 DIAGNOSIS — I10 Essential (primary) hypertension: Secondary | ICD-10-CM | POA: Diagnosis not present

## 2017-05-03 DIAGNOSIS — Z125 Encounter for screening for malignant neoplasm of prostate: Secondary | ICD-10-CM | POA: Diagnosis not present

## 2017-05-03 DIAGNOSIS — Z0000 Encounter for general adult medical examination without abnormal findings: Secondary | ICD-10-CM | POA: Diagnosis not present

## 2017-05-09 DIAGNOSIS — K279 Peptic ulcer, site unspecified, unspecified as acute or chronic, without hemorrhage or perforation: Secondary | ICD-10-CM | POA: Diagnosis not present

## 2017-05-09 DIAGNOSIS — Z0001 Encounter for general adult medical examination with abnormal findings: Secondary | ICD-10-CM | POA: Diagnosis not present

## 2017-05-09 DIAGNOSIS — J449 Chronic obstructive pulmonary disease, unspecified: Secondary | ICD-10-CM | POA: Diagnosis not present

## 2017-05-09 DIAGNOSIS — I10 Essential (primary) hypertension: Secondary | ICD-10-CM | POA: Diagnosis not present

## 2017-05-09 DIAGNOSIS — E7800 Pure hypercholesterolemia, unspecified: Secondary | ICD-10-CM | POA: Diagnosis not present

## 2017-05-14 DIAGNOSIS — Z1212 Encounter for screening for malignant neoplasm of rectum: Secondary | ICD-10-CM | POA: Diagnosis not present

## 2017-05-14 DIAGNOSIS — Z1211 Encounter for screening for malignant neoplasm of colon: Secondary | ICD-10-CM | POA: Diagnosis not present

## 2017-07-20 DIAGNOSIS — I10 Essential (primary) hypertension: Secondary | ICD-10-CM | POA: Diagnosis not present

## 2017-07-20 DIAGNOSIS — R000 Tachycardia, unspecified: Secondary | ICD-10-CM | POA: Diagnosis not present

## 2017-07-20 DIAGNOSIS — I4891 Unspecified atrial fibrillation: Secondary | ICD-10-CM | POA: Diagnosis not present

## 2017-07-27 DIAGNOSIS — I10 Essential (primary) hypertension: Secondary | ICD-10-CM | POA: Diagnosis not present

## 2017-07-27 DIAGNOSIS — I493 Ventricular premature depolarization: Secondary | ICD-10-CM | POA: Diagnosis not present

## 2017-07-27 DIAGNOSIS — I481 Persistent atrial fibrillation: Secondary | ICD-10-CM | POA: Diagnosis not present

## 2017-07-27 DIAGNOSIS — E7800 Pure hypercholesterolemia, unspecified: Secondary | ICD-10-CM | POA: Diagnosis not present

## 2017-08-03 DIAGNOSIS — I4891 Unspecified atrial fibrillation: Secondary | ICD-10-CM | POA: Diagnosis not present

## 2017-08-03 DIAGNOSIS — R0602 Shortness of breath: Secondary | ICD-10-CM | POA: Diagnosis not present

## 2017-08-03 DIAGNOSIS — R002 Palpitations: Secondary | ICD-10-CM | POA: Diagnosis not present

## 2017-08-03 DIAGNOSIS — I10 Essential (primary) hypertension: Secondary | ICD-10-CM | POA: Diagnosis not present

## 2017-08-08 DIAGNOSIS — R0602 Shortness of breath: Secondary | ICD-10-CM | POA: Diagnosis not present

## 2017-08-08 DIAGNOSIS — I481 Persistent atrial fibrillation: Secondary | ICD-10-CM | POA: Diagnosis not present

## 2017-08-08 DIAGNOSIS — R002 Palpitations: Secondary | ICD-10-CM | POA: Diagnosis not present

## 2017-08-08 DIAGNOSIS — I10 Essential (primary) hypertension: Secondary | ICD-10-CM | POA: Diagnosis not present

## 2017-08-20 DIAGNOSIS — I481 Persistent atrial fibrillation: Secondary | ICD-10-CM | POA: Diagnosis not present

## 2017-08-20 DIAGNOSIS — I10 Essential (primary) hypertension: Secondary | ICD-10-CM | POA: Diagnosis not present

## 2017-08-20 DIAGNOSIS — I493 Ventricular premature depolarization: Secondary | ICD-10-CM | POA: Diagnosis not present

## 2017-08-20 DIAGNOSIS — R9439 Abnormal result of other cardiovascular function study: Secondary | ICD-10-CM | POA: Diagnosis not present

## 2017-08-26 DIAGNOSIS — R9439 Abnormal result of other cardiovascular function study: Secondary | ICD-10-CM

## 2017-08-26 NOTE — H&P (View-Only) (Signed)
Robert Hensley September 17, 2017 9:15 AM Location: [facility] Patient #: [id number] DOB: 1940-10-10 Widowed / Language: Robert Hensley / Race: White Male   History of Present Illness [name] MD; 09/17/2017 10:17 AM) Patient words: Last OV 07/27/2017; FU nuc and echo, pt has decreased his HCTZ and he is not dizzy anymore.  The patient is a 77 year old male who presents for a Follow-up for Atrial fibrillation. Robert Hensley is 77 years old white male. Patient goes to gym regularly for exercise. In December 2018, one day when he was walking on the treadmill, noticed that his pulse rate was fast in 140s to 150s. The fast heart rate persisted and he went to see Dr. Shelia Media. Patient was found to be in atrial fibrillation. He was started on Eliquis. He is also on metoprolol and heart rates have been controlled. Patient has occasional dizziness, denies any near-syncope or syncope.  Patient also has history of palpitation like feeling of skipping in the heart off and on for many years. He has been found to have chronic PVCs.  He has mild chronic exertional dyspnea from COPD. He has felt slightly more short of breath in past 2 months and also complains of feeling tired on working. No history of orthopnea or PND. No complaints of chest pain, tightness or heaviness. He has occasional mild swelling in the legs in the evening. He has bilateral varicosities. No history of leg claudication.  Patient has hypertension and hypercholesterolemia. No history of diabetes. He does not smoke. He has mild obesity. He usually walks 1 mile per day.  No history of thyroid problems (Serum TSH on 07/20/2017 was 2.47). No history of TIA or CVA. No history of GI bleed, hematuria or any other abnormal bleeding.     Problem List/Past Medical Anderson Malta Sergeant; 2017/09/17 8:59 AM) Palpitations (R00.2)  Shortness of breath (R06.02)  Emphysema/COPD (J43.9)  Hypertension, benign (I10)  Hyperlipidemia (E78.5)   Persistent atrial fibrillation (I48.1)  CHA2DS2-VASc score- 3 with yearly risk of stroke-3.2%. PVC (premature ventricular contraction) (I49.3)  Lexiscan myoview stress test 08/03/2017: 1. The resting electrocardiogram demonstrated atrial fibrillation, inferior infarct old. Low voltage complexes, cannot exclude anterior infarct old. Stress EKG is nondiagnostic for ischemia as a pharmacologic stress test and occasional PVCs. Stress symptoms included dyspnea. 2. SPECT images demonstrate large perfusion abnormality of severe intensity in the basal inferior, basal inferolateral, mid inferior, mid inferolateral, apical inferior, apical lateral and apical myocardial wall(s) on the stress images. This is very mild peri-infarct ischemia. Left ventricular systolic function was markedly depressed with global hypokinesis, LVEF At 26%. This is a high risk study, consider further cardiac work-up. Hypercholesterolemia (E78.00)  05/03/2017-cholesterol-143, HDL-40, LDL-80, triglycerides-113. Normal liver enzymes. Obesity (BMI 30-39.9) (E66.9)  Laboratory examination (Z01.89)  07/20/2017-TSH is 2.47 Hemoglobin-15.2, hematocrit-42.8, platelets-284 BUN-10, creatinine-0.67. Sodium-137, potassium-4.7.  Allergies Anderson Malta Sergeant; 09-17-17 8:59 AM) Penicillins  facial swelling Anesthesia Extension Tubing *MEDICAL DEVICES AND SUPPLIES*  Low bp  Family History Anderson Malta Sergeant; 2017-09-17 8:59 AM) Father  Deceased. at age 81 after hip surgery (blood poisoning). Mother  Deceased. at age 57 from Alzheimer's; had Hypertension. Brother 2  both living-younger brother has h/o irregular heart beat.  Social History Anderson Malta Sergeant; 09-17-17 8:59 AM) Living Situation  Lives alone. Number of Children  1. Current tobacco use  Former smoker. quit in 1976 Marital status  Widowed. 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  Medication History Anderson Malta Sergeant; 08/20/2017 9:08 AM) Fish Oil (1200MG  Capsule, 1 Oral daily) Active. Vitamin C (500MG  Tablet, 1 Oral daily) Active. Metoprolol Tartrate (25MG  Tablet, 1/2 Oral two times daily) Active. AmLODIPine Besylate (10MG  Tablet, 1 Oral daily) Active. Benazepril HCl (40MG  Tablet, 1 Oral daily) Active. HydroCHLOROthiazide (25MG  Tablet, 1 for 2 days Oral then skip a day) Active. Atorvastatin Calcium (40MG  Tablet, 1 Oral daily) Active. Eliquis (5MG  Tablet, 1 Oral two times daily) Active. Vision Formula (1 Oral daily) Active. Melatonin (3MG  Tablet ER, 1 Oral daily) Active. Vitamin B 12 (50MCG Tablet, 1 Oral daily) Active. Multivitamin Adults 50+ (1 Oral daily) Active. Tylenol (500MG  Capsule, Oral as needed) Active. Medications Reconciled (list present)  Diagnostic Studies History Anderson Malta Sergeant; 08/20/2017 8:11 AM) Echocardiogram [08/08/2017]: 1. Left ventricle cavity is normal in size. Mild concentric hypertrophy of the left ventricle. Normal global wall motion. Visual EF is 55-60%. Calculated EF 55%. 2. Mild (Grade I) aortic regurgitation. 3. Trace mitral regurgitation. 4. Trace tricuspid regurgitation. 5. Mildly dilated ascending aorta, measures 3.8 cm. Nuclear stress test [08/03/2017]: 1. The resting electrocardiogram demonstrated atrial fibrillation, inferior infarct old. Low voltage complexes, cannot exclude anterior infarct old. Stress EKG is nondiagnostic for ischemia as a pharmacologic stress test and occasional PVCs. Stress symptoms included dyspnea. 2. SPECT images demonstrate large perfusion abnormality of severe intensity in the basal inferior, basal inferolateral, mid inferior, mid inferolateral, apical inferior, apical lateral and apical myocardial wall(s) on the stress images. This is very mild peri-infarct ischemia. Left ventricular systolic function was  markedly depressed with global hypokinesis, LVEF At 26%. This is a high risk study, consider further cardiac work-up.    Review of Systems [name] MD; 08/20/2017 10:28 AM)  Note: GENERAL- Feels tired, No fever, chills. No recent weight change. CARDIO VASCULAR- No chest pain, Has exertional shortness of breath, no orthopnea or PND. Has palpitation, Has occ. dizziness, no fainting. Has hypertension and h/o high cholesterol. Has mild swelling on legs in evening. No claudication in legs, No cramps. No h/o DVT PULMONARY- No cough, phlegm, wheezing, not feeling congested in chest. GASTROINTESTINAL- No abdominal pain, nausea, vomiting or diarrhea. No dark tarry stools. Normal appetite. No heartburn. No jaundice. ENDOCRINE- No Thyroid problem, No feeling of excessive heat or cold, No polydipsia or polyuria. No Diabetes. NEUROLOGICAL- No focal motor or sensory symptoms, Good coordination. No seizures. MUSCULOSKELETAL- No generalized myalgias or muscle weakness. No joint swelling SKIN- No skin rash, No pruritus HEMATOLOGY- No anemia, petechiae, excessive bruising, epistaxis, GI bleed or any abnormal bleeding.   Vitals [name] MD; 08/20/2017 10:29 AM) 08/20/2017 9:00 AM Weight: 249.13 lb Height: 73.5in Body Surface Area: 2.37 m Body Mass Index: 32.42 kg/m  Pulse: 60 (Irregular)  P.OX: 99% (Room air) BP: 144/73 (Sitting, Left Arm, Standard)       Physical Exam [name] MD; 08/20/2017 10:28 AM) The physical exam findings are as follows: Note:GENERAL APPEARANCE- Alert, Oriented. Well built, mildly obese. HEENT- Unremarkable, fundi were not examined. Patient wears glasses and dentures. NECK- No JVD. Carotid pulses are 2+, No bruits audible. No thyromegaly. No lymphadenopathy. HEART- Auscultation- Irregular rhythm. Variable S1, S2. No gallops or murmurs audible. CHEST- Normal shape. Normal percussion. Auscultation- Normal breath sounds, No crepitations.  No wheezing. ABDOMEN- Palpation- Soft, Nontender. No hepatosplenomegaly. No masses felt. Auscultation- Normal bowel sounds. No bruits audible. EXTREMITIES- No Clubbing or Cyanosis. No edema on legs or feet. Bilateral varicosities with chronic pigmentation or legs. PERIPHERAL PULSES-  Both femoral pulses- 2+, No bruits audible. Both dorsalis pedis pulses- 2+, Both posterior tibial pulses- 1+    Assessment & Plan [name] MD; 08/20/2017 10:29 AM) Persistent atrial fibrillation (I48.1) Story: CHA2DS2-VASc score- 3 with yearly risk of stroke-3.2%. Echo- 08/08/2017 1. Left ventricle cavity is normal in size. Mild concentric hypertrophy of the left ventricle. Normal global wall motion. Visual EF is 55-60%. Calculated EF 55%. 2. Mild (Grade I) aortic regurgitation. 3. Trace mitral regurgitation. 4. Trace tricuspid regurgitation. 5. Mildly dilated ascending aorta, measures 3.8 cm. Current Plans Started Eliquis 5MG , 1 Tablet two times daily, Mail Order #180, 90 days starting 08/20/2017, Ref. x1. METABOLIC PANEL, BASIC (58850) CBC & PLATELETS (AUTO) (85027) PT (PROTHROMBIN TIME) (27741) Abnormal nuclear stress test (R94.39) Story: Lexiscan myoview stress test 08/03/2017: 1. The resting electrocardiogram demonstrated atrial fibrillation, inferior infarct old. Low voltage complexes, cannot exclude anterior infarct old. Stress EKG is nondiagnostic for ischemia as a pharmacologic stress test and occasional PVCs. Stress symptoms included dyspnea. 2. SPECT images demonstrate large perfusion abnormality of severe intensity in the basal inferior, basal inferolateral, mid inferior, mid inferolateral, apical inferior, apical lateral and apical myocardial wall(s) on the stress images. This is very mild peri-infarct ischemia. Left ventricular systolic function was markedly depressed with global hypokinesis, LVEF At 26%. This is a high risk study, consider further cardiac work-up. Hypertension, benign  (I10) PVC (premature ventricular contraction) (I49.3) Hypercholesterolemia (E78.00) Story: 05/03/2017-cholesterol-143, HDL-40, LDL-80, triglycerides-113. Normal liver enzymes. Obesity (BMI 30-39.9) (E66.9) Laboratory examination (O87.86) Story: 07/20/2017-TSH is 2.47 Hemoglobin-15.2, hematocrit-42.8, platelets-284 BUN-10, creatinine-0.67. Sodium-137, potassium-4.7.  Note:Findings of echocardiogram and stress nuclear scans were explained to the patient. The stress nuclear scans are high risk, please see the detail report above. Also, ejection fraction was severely depressed on the nuclear scans there is it was normal on the echocardiogram. It is likely that the significant reduction in EF may have occurred on nuclear scans due to severe ischemia. It is also likely that etiology of atrial fibrillation may be ischemia. In view of these findings, I have recommended cardiac catheterization. We discussed regarding risks, benefits, alternatives to this including CTA and continued medical therapy. Patient wants to proceed. Understands <1-2% risk of death, stroke, MI, urgent CABG, bleeding, infection, renal failure but not limited to these. Pt. verbalized understanding, has been scheduled for cardiac catheterization, and possible angioplasty/stent implant by Dr. Virgina Jock.  Patient was advised to hold Eliquis 36 hours prior to cardiac catheterization. He verbalized understanding.  Patient continues to remain in atrial fibrillation, ventricular response is controlled. He does not have any symptoms of dizziness or near-syncope etc. He was advised to continue present medications.   Dietary instructions were again given. Patient was advised to follow low-salt, low-cholesterol diet.  I will see him in follow-up after the cardiac catheterization. We will consider cardioversion after that. This was a 30 minutes visit with more than half the time spent face-to-face with the patient for explaining his  condition, test results, further management including indications and complications of cardiac catheterization etc.  CC: Dr. Deland Pretty.  Signed electronically by Despina Hick, MD (08/20/2017 5:12 PM)

## 2017-08-26 NOTE — Progress Notes (Signed)
07/20/2017-TSH is 2.47 Hemoglobin-15.2, hematocrit-42.8, platelets-284 BUN-10, creatinine-0.67.  Sodium-137, potassium-4.7.

## 2017-08-26 NOTE — H&P (Signed)
Robert Hensley August 23, 2017 9:15 AM Location: [facility] Patient #: [id number] DOB: 12-Mar-1941 Widowed / Language: Robert Hensley / Race: White Male   History of Present Illness Robert Hensley; 08/23/2017 10:17 AM) Patient words: Last OV 07/27/2017; FU nuc and echo, pt has decreased his HCTZ and he is not dizzy anymore.  The patient is a 77 year old male who presents for a Follow-up for Atrial fibrillation. Robert Hensley is 77 years old white male. Patient goes to gym regularly for exercise. In December 2018, one day when he was walking on the treadmill, noticed that his pulse rate was fast in 140s to 150s. The fast heart rate persisted and he went to see Dr. Shelia Media. Patient was found to be in atrial fibrillation. He was started on Eliquis. He is also on metoprolol and heart rates have been controlled. Patient has occasional dizziness, denies any near-syncope or syncope.  Patient also has history of palpitation like feeling of skipping in the heart off and on for many years. He has been found to have chronic PVCs.  He has mild chronic exertional dyspnea from COPD. He has felt slightly more short of breath in past 2 months and also complains of feeling tired on working. No history of orthopnea or PND. No complaints of chest pain, tightness or heaviness. He has occasional mild swelling in the legs in the evening. He has bilateral varicosities. No history of leg claudication.  Patient has hypertension and hypercholesterolemia. No history of diabetes. He does not smoke. He has mild obesity. He usually walks 1 mile per day.  No history of thyroid problems (Serum TSH on 07/20/2017 was 2.47). No history of TIA or CVA. No history of GI bleed, hematuria or any other abnormal bleeding.     Problem List/Past Medical Robert Hensley; 08/23/2017 8:59 AM) Palpitations (R00.2)  Shortness of breath (R06.02)  Emphysema/COPD (J43.9)  Hypertension, benign (I10)  Hyperlipidemia (E78.5)   Persistent atrial fibrillation (I48.1)  CHA2DS2-VASc score- 3 with yearly risk of stroke-3.2%. PVC (premature ventricular contraction) (I49.3)  Lexiscan myoview stress test 08/03/2017: 1. The resting electrocardiogram demonstrated atrial fibrillation, inferior infarct old. Low voltage complexes, cannot exclude anterior infarct old. Stress EKG is nondiagnostic for ischemia as a pharmacologic stress test and occasional PVCs. Stress symptoms included dyspnea. 2. SPECT images demonstrate large perfusion abnormality of severe intensity in the basal inferior, basal inferolateral, mid inferior, mid inferolateral, apical inferior, apical lateral and apical myocardial wall(s) on the stress images. This is very mild peri-infarct ischemia. Left ventricular systolic function was markedly depressed with global hypokinesis, LVEF At 26%. This is a high risk study, consider further cardiac work-up. Hypercholesterolemia (E78.00)  05/03/2017-cholesterol-143, HDL-40, LDL-80, triglycerides-113. Normal liver enzymes. Obesity (BMI 30-39.9) (E66.9)  Laboratory examination (Z01.89)  07/20/2017-TSH is 2.47 Hemoglobin-15.2, hematocrit-42.8, platelets-284 BUN-10, creatinine-0.67. Sodium-137, potassium-4.7.  Allergies Robert Hensley; 2017/08/23 8:59 AM) Penicillins  facial swelling Anesthesia Extension Tubing *MEDICAL DEVICES AND SUPPLIES*  Low bp  Family History Robert Hensley; 08/23/2017 8:59 AM) Father  Deceased. at age 28 after hip surgery (blood poisoning). Mother  Deceased. at age 6 from Alzheimer's; had Hypertension. Brother 2  both living-younger brother has h/o irregular heart beat.  Social History Robert Hensley; 2017/08/23 8:59 AM) Living Situation  Lives alone. Number of Children  1. Current tobacco use  Former smoker. quit in 1976 Marital status  Widowed. 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  Medication History Robert Hensley; 08/20/2017 9:08 AM) Fish Oil (1200MG  Capsule, 1 Oral daily) Active. Vitamin C (500MG  Tablet, 1 Oral daily) Active. Metoprolol Tartrate (25MG  Tablet, 1/2 Oral two times daily) Active. AmLODIPine Besylate (10MG  Tablet, 1 Oral daily) Active. Benazepril HCl (40MG  Tablet, 1 Oral daily) Active. HydroCHLOROthiazide (25MG  Tablet, 1 for 2 days Oral then skip a day) Active. Atorvastatin Calcium (40MG  Tablet, 1 Oral daily) Active. Eliquis (5MG  Tablet, 1 Oral two times daily) Active. Vision Formula (1 Oral daily) Active. Melatonin (3MG  Tablet ER, 1 Oral daily) Active. Vitamin B 12 (50MCG Tablet, 1 Oral daily) Active. Multivitamin Adults 50+ (1 Oral daily) Active. Tylenol (500MG  Capsule, Oral as needed) Active. Medications Reconciled (list present)  Diagnostic Studies History Robert Hensley; 08/20/2017 8:11 AM) Echocardiogram [08/08/2017]: 1. Left ventricle cavity is normal in size. Mild concentric hypertrophy of the left ventricle. Normal global wall motion. Visual EF is 55-60%. Calculated EF 55%. 2. Mild (Grade I) aortic regurgitation. 3. Trace mitral regurgitation. 4. Trace tricuspid regurgitation. 5. Mildly dilated ascending aorta, measures 3.8 cm. Nuclear stress test [08/03/2017]: 1. The resting electrocardiogram demonstrated atrial fibrillation, inferior infarct old. Low voltage complexes, cannot exclude anterior infarct old. Stress EKG is nondiagnostic for ischemia as a pharmacologic stress test and occasional PVCs. Stress symptoms included dyspnea. 2. SPECT images demonstrate large perfusion abnormality of severe intensity in the basal inferior, basal inferolateral, mid inferior, mid inferolateral, apical inferior, apical lateral and apical myocardial wall(s) on the stress images. This is very mild peri-infarct ischemia. Left ventricular systolic function was  markedly depressed with global hypokinesis, LVEF At 26%. This is a high risk study, consider further cardiac work-up.    Review of Systems Robert Hensley; 08/20/2017 10:28 AM)  Note: GENERAL- Feels tired, No fever, chills. No recent weight change. CARDIO VASCULAR- No chest pain, Has exertional shortness of breath, no orthopnea or PND. Has palpitation, Has occ. dizziness, no fainting. Has hypertension and h/o high cholesterol. Has mild swelling on legs in evening. No claudication in legs, No cramps. No h/o DVT PULMONARY- No cough, phlegm, wheezing, not feeling congested in chest. GASTROINTESTINAL- No abdominal pain, nausea, vomiting or diarrhea. No dark tarry stools. Normal appetite. No heartburn. No jaundice. ENDOCRINE- No Thyroid problem, No feeling of excessive heat or cold, No polydipsia or polyuria. No Diabetes. NEUROLOGICAL- No focal motor or sensory symptoms, Good coordination. No seizures. MUSCULOSKELETAL- No generalized myalgias or muscle weakness. No joint swelling SKIN- No skin rash, No pruritus HEMATOLOGY- No anemia, petechiae, excessive bruising, epistaxis, GI bleed or any abnormal bleeding.   Vitals Robert Hensley; 08/20/2017 10:29 AM) 08/20/2017 9:00 AM Weight: 249.13 lb Height: 73.5in Body Surface Area: 2.37 m Body Mass Index: 32.42 kg/m  Pulse: 60 (Irregular)  P.OX: 99% (Room air) BP: 144/73 (Sitting, Left Arm, Standard)       Physical Exam Robert Hensley; 08/20/2017 10:28 AM) The physical exam findings are as follows: Note:GENERAL APPEARANCE- Alert, Oriented. Well built, mildly obese. HEENT- Unremarkable, fundi were not examined. Patient wears glasses and dentures. NECK- No JVD. Carotid pulses are 2+, No bruits audible. No thyromegaly. No lymphadenopathy. HEART- Auscultation- Irregular rhythm. Variable S1, S2. No gallops or murmurs audible. CHEST- Normal shape. Normal percussion. Auscultation- Normal breath sounds, No crepitations.  No wheezing. ABDOMEN- Palpation- Soft, Nontender. No hepatosplenomegaly. No masses felt. Auscultation- Normal bowel sounds. No bruits audible. EXTREMITIES- No Clubbing or Cyanosis. No edema on legs or feet. Bilateral varicosities with chronic pigmentation or legs. PERIPHERAL PULSES-  Both femoral pulses- 2+, No bruits audible. Both dorsalis pedis pulses- 2+, Both posterior tibial pulses- 1+    Assessment & Plan Robert Hensley; 08/20/2017 10:29 AM) Persistent atrial fibrillation (I48.1) Story: CHA2DS2-VASc score- 3 with yearly risk of stroke-3.2%. Echo- 08/08/2017 1. Left ventricle cavity is normal in size. Mild concentric hypertrophy of the left ventricle. Normal global wall motion. Visual EF is 55-60%. Calculated EF 55%. 2. Mild (Grade I) aortic regurgitation. 3. Trace mitral regurgitation. 4. Trace tricuspid regurgitation. 5. Mildly dilated ascending aorta, measures 3.8 cm. Current Plans Started Eliquis 5MG , 1 Tablet two times daily, Mail Order #180, 90 days starting 08/20/2017, Ref. x1. METABOLIC PANEL, BASIC (16109) CBC & PLATELETS (AUTO) (85027) PT (PROTHROMBIN TIME) (60454) Abnormal nuclear stress test (R94.39) Story: Lexiscan myoview stress test 08/03/2017: 1. The resting electrocardiogram demonstrated atrial fibrillation, inferior infarct old. Low voltage complexes, cannot exclude anterior infarct old. Stress EKG is nondiagnostic for ischemia as a pharmacologic stress test and occasional PVCs. Stress symptoms included dyspnea. 2. SPECT images demonstrate large perfusion abnormality of severe intensity in the basal inferior, basal inferolateral, mid inferior, mid inferolateral, apical inferior, apical lateral and apical myocardial wall(s) on the stress images. This is very mild peri-infarct ischemia. Left ventricular systolic function was markedly depressed with global hypokinesis, LVEF At 26%. This is a high risk study, consider further cardiac work-up. Hypertension, benign  (I10) PVC (premature ventricular contraction) (I49.3) Hypercholesterolemia (E78.00) Story: 05/03/2017-cholesterol-143, HDL-40, LDL-80, triglycerides-113. Normal liver enzymes. Obesity (BMI 30-39.9) (E66.9) Laboratory examination (U98.11) Story: 07/20/2017-TSH is 2.47 Hemoglobin-15.2, hematocrit-42.8, platelets-284 BUN-10, creatinine-0.67. Sodium-137, potassium-4.7.  Note:Findings of echocardiogram and stress nuclear scans were explained to the patient. The stress nuclear scans are high risk, please see the detail report above. Also, ejection fraction was severely depressed on the nuclear scans there is it was normal on the echocardiogram. It is likely that the significant reduction in EF may have occurred on nuclear scans due to severe ischemia. It is also likely that etiology of atrial fibrillation may be ischemia. In view of these findings, I have recommended cardiac catheterization. We discussed regarding risks, benefits, alternatives to this including CTA and continued medical therapy. Patient wants to proceed. Understands <1-2% risk of death, stroke, MI, urgent CABG, bleeding, infection, renal failure but not limited to these. Pt. verbalized understanding, has been scheduled for cardiac catheterization, and possible angioplasty/stent implant by Dr. Virgina Jock.  Patient was advised to hold Eliquis 36 hours prior to cardiac catheterization. He verbalized understanding.  Patient continues to remain in atrial fibrillation, ventricular response is controlled. He does not have any symptoms of dizziness or near-syncope etc. He was advised to continue present medications.   Dietary instructions were again given. Patient was advised to follow low-salt, low-cholesterol diet.  I will see him in follow-up after the cardiac catheterization. We will consider cardioversion after that. This was a 30 minutes visit with more than half the time spent face-to-face with the patient for explaining his  condition, test results, further management including indications and complications of cardiac catheterization etc.  CC: Dr. Deland Pretty.  Signed electronically by Despina Hick, Hensley (08/20/2017 5:12 PM)

## 2017-08-28 ENCOUNTER — Encounter (HOSPITAL_COMMUNITY)
Admission: RE | Disposition: A | Discharge status: Home / Self Care | Payer: Self-pay | Source: Ambulatory Visit | Attending: Cardiology

## 2017-08-28 ENCOUNTER — Ambulatory Visit (HOSPITAL_COMMUNITY)
Admission: RE | Admit: 2017-08-28 | Discharge: 2017-08-28 | Disposition: A | Discharge status: Home / Self Care | Payer: Medicare Other | Source: Ambulatory Visit | Attending: Cardiology | Admitting: Cardiology

## 2017-08-28 DIAGNOSIS — R002 Palpitations: Secondary | ICD-10-CM | POA: Diagnosis not present

## 2017-08-28 DIAGNOSIS — I2510 Atherosclerotic heart disease of native coronary artery without angina pectoris: Secondary | ICD-10-CM | POA: Diagnosis not present

## 2017-08-28 DIAGNOSIS — I4891 Unspecified atrial fibrillation: Secondary | ICD-10-CM | POA: Diagnosis not present

## 2017-08-28 DIAGNOSIS — Z880 Allergy status to penicillin: Secondary | ICD-10-CM | POA: Insufficient documentation

## 2017-08-28 DIAGNOSIS — R9439 Abnormal result of other cardiovascular function study: Secondary | ICD-10-CM

## 2017-08-28 DIAGNOSIS — Z6832 Body mass index (BMI) 32.0-32.9, adult: Secondary | ICD-10-CM | POA: Insufficient documentation

## 2017-08-28 DIAGNOSIS — Z7901 Long term (current) use of anticoagulants: Secondary | ICD-10-CM | POA: Insufficient documentation

## 2017-08-28 DIAGNOSIS — E669 Obesity, unspecified: Secondary | ICD-10-CM | POA: Diagnosis not present

## 2017-08-28 DIAGNOSIS — E7800 Pure hypercholesterolemia, unspecified: Secondary | ICD-10-CM | POA: Insufficient documentation

## 2017-08-28 DIAGNOSIS — I351 Nonrheumatic aortic (valve) insufficiency: Secondary | ICD-10-CM | POA: Diagnosis not present

## 2017-08-28 DIAGNOSIS — Z8546 Personal history of malignant neoplasm of prostate: Secondary | ICD-10-CM | POA: Diagnosis not present

## 2017-08-28 DIAGNOSIS — Z79899 Other long term (current) drug therapy: Secondary | ICD-10-CM | POA: Diagnosis not present

## 2017-08-28 DIAGNOSIS — I481 Persistent atrial fibrillation: Secondary | ICD-10-CM | POA: Insufficient documentation

## 2017-08-28 DIAGNOSIS — I10 Essential (primary) hypertension: Secondary | ICD-10-CM | POA: Diagnosis not present

## 2017-08-28 DIAGNOSIS — I493 Ventricular premature depolarization: Secondary | ICD-10-CM | POA: Insufficient documentation

## 2017-08-28 DIAGNOSIS — Z87891 Personal history of nicotine dependence: Secondary | ICD-10-CM | POA: Diagnosis not present

## 2017-08-28 DIAGNOSIS — J449 Chronic obstructive pulmonary disease, unspecified: Secondary | ICD-10-CM | POA: Diagnosis not present

## 2017-08-28 DIAGNOSIS — Z91048 Other nonmedicinal substance allergy status: Secondary | ICD-10-CM | POA: Diagnosis not present

## 2017-08-28 HISTORY — PX: LEFT HEART CATH AND CORONARY ANGIOGRAPHY: CATH118249

## 2017-08-28 SURGERY — LEFT HEART CATH AND CORONARY ANGIOGRAPHY
Anesthesia: LOCAL

## 2017-08-28 MED ORDER — SODIUM CHLORIDE 0.9 % IV SOLN: 250.0000 mL | INTRAVENOUS | Status: DC | PRN

## 2017-08-28 MED ORDER — SODIUM CHLORIDE 0.9 % IV SOLN: INTRAVENOUS | Status: DC

## 2017-08-28 MED ORDER — MIDAZOLAM HCL 2 MG/2ML IJ SOLN
INTRAMUSCULAR | Status: AC
  Filled 2017-08-28: qty 2

## 2017-08-28 MED ORDER — ASPIRIN 81 MG PO CHEW
81.0000 mg | CHEWABLE_TABLET | ORAL | Status: AC
  Administered 2017-08-28: 81 mg via ORAL

## 2017-08-28 MED ORDER — HEPARIN SODIUM (PORCINE) 1000 UNIT/ML IJ SOLN
INTRAMUSCULAR | Status: AC
  Filled 2017-08-28: qty 1

## 2017-08-28 MED ORDER — SODIUM CHLORIDE 0.9% FLUSH: 3.0000 mL | Freq: Two times a day (BID) | INTRAVENOUS | Status: DC

## 2017-08-28 MED ORDER — SODIUM CHLORIDE 0.9% FLUSH: 3.0000 mL | INTRAVENOUS | Status: DC | PRN

## 2017-08-28 MED ORDER — HEPARIN SODIUM (PORCINE) 1000 UNIT/ML IJ SOLN
INTRAMUSCULAR | Status: DC | PRN
  Administered 2017-08-28: 5000 [IU] via INTRAVENOUS

## 2017-08-28 MED ORDER — MIDAZOLAM HCL 2 MG/2ML IJ SOLN
INTRAMUSCULAR | Status: DC | PRN
  Administered 2017-08-28: 1 mg via INTRAVENOUS

## 2017-08-28 MED ORDER — VERAPAMIL HCL 2.5 MG/ML IV SOLN
INTRAVENOUS | Status: DC | PRN
  Administered 2017-08-28: 10 mL via INTRA_ARTERIAL

## 2017-08-28 MED ORDER — FENTANYL CITRATE (PF) 100 MCG/2ML IJ SOLN
INTRAMUSCULAR | Status: DC | PRN
  Administered 2017-08-28: 25 ug via INTRAVENOUS

## 2017-08-28 MED ORDER — VERAPAMIL HCL 2.5 MG/ML IV SOLN
INTRAVENOUS | Status: AC
  Filled 2017-08-28: qty 2

## 2017-08-28 MED ORDER — HEPARIN (PORCINE) IN NACL 2-0.9 UNIT/ML-% IJ SOLN
INTRAMUSCULAR | Status: AC
  Filled 2017-08-28: qty 1000

## 2017-08-28 MED ORDER — SODIUM CHLORIDE 0.9 % IV SOLN
INTRAVENOUS | Status: AC
  Administered 2017-08-28: 13:00:00 via INTRAVENOUS

## 2017-08-28 MED ORDER — FENTANYL CITRATE (PF) 100 MCG/2ML IJ SOLN
INTRAMUSCULAR | Status: AC
  Filled 2017-08-28: qty 2

## 2017-08-28 MED ORDER — ONDANSETRON HCL 4 MG/2ML IJ SOLN: 4.0000 mg | Freq: Four times a day (QID) | INTRAMUSCULAR | Status: DC | PRN

## 2017-08-28 MED ORDER — ASPIRIN 81 MG PO CHEW
CHEWABLE_TABLET | ORAL | Status: AC
Start: 2017-08-28 — End: 2017-08-28
  Administered 2017-08-28: 81 mg via ORAL
  Filled 2017-08-28: qty 1

## 2017-08-28 MED ORDER — HEPARIN (PORCINE) IN NACL 2-0.9 UNIT/ML-% IJ SOLN
INTRAMUSCULAR | Status: AC | PRN
  Administered 2017-08-28: 1000 mL

## 2017-08-28 MED ORDER — IOHEXOL 350 MG/ML SOLN
INTRAVENOUS | Status: DC | PRN
  Administered 2017-08-28: 45 mL via INTRAVENOUS

## 2017-08-28 MED ORDER — ACETAMINOPHEN 325 MG PO TABS: 650.0000 mg | ORAL_TABLET | ORAL | Status: DC | PRN

## 2017-08-28 MED ORDER — LIDOCAINE HCL (PF) 1 % IJ SOLN
INTRAMUSCULAR | Status: DC | PRN
  Administered 2017-08-28: 2 mL via INTRADERMAL

## 2017-08-28 SURGICAL SUPPLY — 11 items
CATH INFINITI 5 FR JL3.5 (CATHETERS) ×1 IMPLANT
CATH INFINITI 5F JL4 125CM (CATHETERS) ×1 IMPLANT
CATH INFINITI 5FR ANG PIGTAIL (CATHETERS) ×1 IMPLANT
CATH INFINITI 5FR JR4 125CM (CATHETERS) ×1 IMPLANT
DEVICE RAD COMP TR BAND LRG (VASCULAR PRODUCTS) ×1 IMPLANT
GUIDEWIRE INQWIRE 1.5J.035X260 (WIRE) IMPLANT
INQWIRE 1.5J .035X260CM (WIRE) ×4
KIT HEART LEFT (KITS) ×2 IMPLANT
PACK CARDIAC CATHETERIZATION (CUSTOM PROCEDURE TRAY) ×2 IMPLANT
TRANSDUCER W/STOPCOCK (MISCELLANEOUS) ×2 IMPLANT
TUBING CIL FLEX 10 FLL-RA (TUBING) ×2 IMPLANT

## 2017-08-28 NOTE — Interval H&P Note (Signed)
History and Physical Interval Note:  08/28/2017 3:55 PM  Robert Hensley  has presented today for surgery, with the diagnosis of positive stress  The various methods of treatment have been discussed with the patient and family. After consideration of risks, benefits and other options for treatment, the patient has consented to  Procedure(s): LEFT HEART CATH AND CORONARY ANGIOGRAPHY (N/A) as a surgical intervention .  The patient's history has been reviewed, patient examined, no change in status, stable for surgery.  I have reviewed the patient's chart and labs.  Questions were answered to the patient's satisfaction.     2016/2017 Appropriate Use Criteria for Coronary Revascularization Clinical Presentation: Diabetes Mellitus? Symptom Status? S/P CABG? Antianginal Therapy (# of long-acting drugs)? Results of Non-invasive testing? FFR/iFR results in all diseased vessels? Patient undergoing renal transplant? Patient undergoing percutaneous valve procedure (TAVR, MitraClip, Others)? Symptom Status:  Ischemic Symptoms  Non-invasive Testing:  High risk  If no or indeterminate stress test, FFR/iFR results in all diseased vessels:  N/A  Diabetes Mellitus:  No  S/P CABG:  No  Antianginal therapy (number of long-acting drugs):  >=2  Patient undergoing renal transplant:  No  Patient undergoing percutaneous valve procedure:  No    newline 1 Vessel Disease PCI CABG  No proximal LAD involvement, No proximal left dominant LCX involvement A (8); Indication 2 M (6); Indication 2   Proximal left dominant LCX involvement A (8); Indication 5 A (8); Indication 5   Proximal LAD involvement A (8); Indication 5 A (8); Indication 5   newline 2 Vessel Disease  No proximal LAD involvement A (8); Indication 8 A (7); Indication 8   Proximal LAD involvement A (8); Indication 11 A (8); Indication 11   newline 3 Vessel Disease  Low disease complexity (e.g., focal stenoses, SYNTAX <=22) A (8); Indication 17 A (8);  Indication 17   Intermediate or high disease complexity (e.g., SYNTAX >=23) M (6); Indication 21 A (9); Indication 21   newline Left Main Disease  Isolated LMCA disease: ostial or midshaft A (7); Indication 24 A (9); Indication 24   Isolated LMCA disease: bifurcation involvement M (6); Indication 25 A (9); Indication 25   LMCA ostial or midshaft, concurrent low disease burden multivessel disease (e.g., 1-2 additional focal stenoses, SYNTAX <=22) A (7); Indication 26 A (9); Indication 26   LMCA ostial or midshaft, concurrent intermediate or high disease burden multivessel disease (e.g., 1-2 additional bifurcation stenoses, long stenoses, SYNTAX >=23) M (4); Indication 27 A (9); Indication 27   LMCA bifurcation involvement, concurrent low disease burden multivessel disease (e.g., 1-2 additional focal stenoses, SYNTAX <=22) M (6); Indication 28 A (9); Indication 28   LMCA bifurcation involvement, concurrent intermediate or high disease burden multivessel disease (e.g., 1-2 additional bifurcation stenoses, long stenoses, SYNTAX >=23) R (3); Indication 29 A (9); Indication [place]

## 2017-08-28 NOTE — Interval H&P Note (Signed)
History and Physical Interval Note:  08/28/2017 1:49 PM  Robert Hensley  has presented today for surgery, with the diagnosis of positive stress  The various methods of treatment have been discussed with the patient and family. After consideration of risks, benefits and other options for treatment, the patient has consented to  Procedure(s): LEFT HEART CATH AND CORONARY ANGIOGRAPHY (N/A) as a surgical intervention .  The patient's history has been reviewed, patient examined, no change in status, stable for surgery.  I have reviewed the patient's chart and labs.  Questions were answered to the patient's satisfaction.     2016/2017 Appropriate Use Criteria for Coronary Revascularization Clinical Presentation: Diabetes Mellitus? Symptom Status? S/P CABG? Antianginal Therapy (# of long-acting drugs)? Results of Non-invasive testing? FFR/iFR results in all diseased vessels? Patient undergoing renal transplant? Patient undergoing percutaneous valve procedure (TAVR, MitraClip, Others)? Symptom Status:  Ischemic Symptoms  Non-invasive Testing:  High risk  If no or indeterminate stress test, FFR/iFR results in all diseased vessels:  N/A  Diabetes Mellitus:  Yes  S/P CABG:  Yes  Antianginal therapy (number of long-acting drugs):  >=2  Patient undergoing renal transplant:  No  Patient undergoing percutaneous valve procedure:  No     LIMA-LAD patent and without significant stenoses PCI CABG  Stenosis supplying 1 territory (bypass graft or native artery) other than anterior A (8); Indication 19 M (5); Indication 31   Stenoses supplying 2 territories (bypass graft or native artery, either 2 separate vessels or sequential graft supplying 2 territories) not including anterior territory A (8); Indication 1 M (6); Indication 34   newline LIMA-LAD not patent  Stenosis supplying 1 territory (bypass graft or native artery)-anterior (LAD) territory A (8); Indication 106 M (6); Indication 36   Stenoses  supplying 2 territories (bypass graft or native artery, either 2 separate vessels or sequential graft supplying 2 territories)-LAD plus other territory A (8); Indication 39 A (8); Indication 39   Stenoses supplying 3 territories (bypass graft or native arteries, separate vessels, sequential grafts, or combination thereof)-LAD plus 2 other territories A (8); Indication 41 A (8); [place]

## 2017-08-28 NOTE — Discharge Instructions (Signed)
Radial Site Care °Refer to this sheet in the next few weeks. These instructions provide you with information about caring for yourself after your procedure. Your health care provider may also give you more specific instructions. Your treatment has been planned according to current medical practices, but problems sometimes occur. Call your health care provider if you have any problems or questions after your procedure. °What can I expect after the procedure? °After your procedure, it is typical to have the following: °· Bruising at the radial site that usually fades within 1-2 weeks. °· Blood collecting in the tissue (hematoma) that may be painful to the touch. It should usually decrease in size and tenderness within 1-2 weeks. ° °Follow these instructions at home: °· Take medicines only as directed by your health care provider. °· You may shower 24-48 hours after the procedure or as directed by your health care provider. Remove the bandage (dressing) and gently wash the site with plain soap and water. Pat the area dry with a clean towel. Do not rub the site, because this may cause bleeding. °· Do not take baths, swim, or use a hot tub until your health care provider approves. °· Check your insertion site every day for redness, swelling, or drainage. °· Do not apply powder or lotion to the site. °· Do not flex or bend the affected arm for 24 hours or as directed by your health care provider. °· Do not push or pull heavy objects with the affected arm for 24 hours or as directed by your health care provider. °· Do not lift over 10 lb (4.5 kg) for 5 days after your procedure or as directed by your health care provider. °· Ask your health care provider when it is okay to: °? Return to work or school. °? Resume usual physical activities or sports. °? Resume sexual activity. °· Do not drive home if you are discharged the same day as the procedure. Have someone else drive you. °· You may drive 24 hours after the procedure  unless otherwise instructed by your health care provider. °· Do not operate machinery or power tools for 24 hours after the procedure. °· If your procedure was done as an outpatient procedure, which means that you went home the same day as your procedure, a responsible adult should be with you for the first 24 hours after you arrive home. °· Keep all follow-up visits as directed by your health care provider. This is important. °Contact a health care provider if: °· You have a fever. °· You have chills. °· You have increased bleeding from the radial site. Hold pressure on the site. °Get help right away if: °· You have unusual pain at the radial site. °· You have redness, warmth, or swelling at the radial site. °· You have drainage (other than a small amount of blood on the dressing) from the radial site. °· The radial site is bleeding, and the bleeding does not stop after 30 minutes of holding steady pressure on the site. °· Your arm or hand becomes pale, cool, tingly, or numb. °This information is not intended to replace advice given to you by your health care provider. Make sure you discuss any questions you have with your health care provider. °Document Released: 08/12/2010 Document Revised: 12/16/2015 Document Reviewed: 01/26/2014 °Elsevier Interactive Patient Education © 2018 [employer] ° ° ° °Moderate Conscious Sedation, Adult, Care After °These instructions provide you with information about caring for yourself after your procedure. Your health care provider   may also give you more specific instructions. Your treatment has been planned according to current medical practices, but problems sometimes occur. Call your health care provider if you have any problems or questions after your procedure. °What can I expect after the procedure? °After your procedure, it is common: °· To feel sleepy for several hours. °· To feel clumsy and have poor balance for several hours. °· To have poor judgment for several  hours. °· To vomit if you eat too soon. ° °Follow these instructions at home: °For at least 24 hours after the procedure: ° °· Do not: °? Participate in activities where you could fall or become injured. °? Drive. °? Use heavy machinery. °? Drink alcohol. °? Take sleeping pills or medicines that cause drowsiness. °? Make important decisions or sign legal documents. °? Take care of children on your own. °· Rest. °Eating and drinking °· Follow the diet recommended by your health care provider. °· If you vomit: °? Drink water, juice, or soup when you can drink without vomiting. °? Make sure you have little or no nausea before eating solid foods. °General instructions °· Have a responsible adult stay with you until you are awake and alert. °· Take over-the-counter and prescription medicines only as told by your health care provider. °· If you smoke, do not smoke without supervision. °· Keep all follow-up visits as told by your health care provider. This is important. °Contact a health care provider if: °· You keep feeling nauseous or you keep vomiting. °· You feel light-headed. °· You develop a rash. °· You have a fever. °Get help right away if: °· You have trouble breathing. °This information is not intended to replace advice given to you by your health care provider. Make sure you discuss any questions you have with your health care provider. °Document Released: 04/30/2013 Document Revised: 12/13/2015 Document Reviewed: 10/30/2015 °Elsevier Interactive Patient Education © 2018 [employer] Inc[employer] ° °

## 2017-08-29 ENCOUNTER — Encounter (HOSPITAL_COMMUNITY): Payer: Self-pay | Admitting: Cardiology

## 2017-08-30 MED FILL — Heparin Sodium (Porcine) 2 Unit/ML in Sodium Chloride 0.9%: INTRAMUSCULAR | Qty: 1000 | Status: AC

## 2017-09-04 DIAGNOSIS — I2510 Atherosclerotic heart disease of native coronary artery without angina pectoris: Secondary | ICD-10-CM | POA: Diagnosis not present

## 2017-09-04 DIAGNOSIS — I493 Ventricular premature depolarization: Secondary | ICD-10-CM | POA: Diagnosis not present

## 2017-09-04 DIAGNOSIS — I10 Essential (primary) hypertension: Secondary | ICD-10-CM | POA: Diagnosis not present

## 2017-09-04 DIAGNOSIS — I481 Persistent atrial fibrillation: Secondary | ICD-10-CM | POA: Diagnosis not present

## 2017-09-09 NOTE — H&P (Signed)
Robert Hensley 10/03/17 9:15 AM Location: [facility] Patient #: [id number] DOB: [birth year] Widowed / Language: [name] / Race: White Male   History of Present Illness [name] MD; 2017-10-03 4:38 PM) Patient words: 7-10 day f/u cath; Last office visit 08/20/17.  The patient is a 77 year old male who presents for a Follow-up for Atrial fibrillation. Mr. Colleran is 77 years old white male. Patient had cardiac cath. on 08/28/2017-there were minimal luminal irregularities in LAD and circumflex, other arteries were normal.  Patient has persistent atrial fibrillation. He was started on Eliquis. He is also on metoprolol and heart rates have been controlled. Patient has occasional dizziness, denies any near-syncope or syncope.  Patient also has history of palpitation like feeling of skipping in the heart off and on for many years. He has been found to have chronic PVCs.  He has mild chronic exertional dyspnea from COPD. He has felt slightly more short of breath in past 2 months and also complains of feeling tired on working. No history of orthopnea or PND. No complaints of chest pain, tightness or heaviness. He has occasional mild swelling in the legs in the evening. He has bilateral varicosities. No history of leg claudication.  Patient has hypertension and hypercholesterolemia. No history of diabetes. He does not smoke. He has mild obesity. He usually walks 1 mile per day.  No history of thyroid problems (Serum TSH on 07/20/2017 was 2.47). No history of TIA or CVA. No history of GI bleed, hematuria or any other abnormal bleeding.     Problem List/Past Medical (April Harrington; Oct 03, 2017 9:03 AM) Palpitations (R00.2)  Shortness of breath (R06.02)  Emphysema/COPD (J43.9)  Hypertension, benign (I10)  Hyperlipidemia (E78.5)  Persistent atrial fibrillation (I48.1)  CHA2DS2-VASc score- 3 with yearly risk of stroke-3.2%. Echo- 08/08/2017 1. Left ventricle cavity is  normal in size. Mild concentric hypertrophy of the left ventricle. Normal global wall motion. Visual EF is 55-60%. Calculated EF 55%. 2. Mild (Grade I) aortic regurgitation. 3. Trace mitral regurgitation. 4. Trace tricuspid regurgitation. 5. Mildly dilated ascending aorta, measures 3.8 cm. PVC (premature ventricular contraction) (I49.3)  Hypercholesterolemia (E78.00)  05/03/2017-cholesterol-143, HDL-40, LDL-80, triglycerides-113. Normal liver enzymes. Obesity (BMI 30-39.9) (E66.9)  Laboratory examination (Z01.89)  07/20/2017-TSH is 2.47 Hemoglobin-15.2, hematocrit-42.8, platelets-284 BUN-10, creatinine-0.67. Sodium-137, potassium-4.7. Abnormal nuclear stress test (R94.39)  Lexiscan myoview stress test 08/03/2017: 1. The resting electrocardiogram demonstrated atrial fibrillation, inferior infarct old. Low voltage complexes, cannot exclude anterior infarct old. Stress EKG is nondiagnostic for ischemia as a pharmacologic stress test and occasional PVCs. Stress symptoms included dyspnea. 2. SPECT images demonstrate large perfusion abnormality of severe intensity in the basal inferior, basal inferolateral, mid inferior, mid inferolateral, apical inferior, apical lateral and apical myocardial wall(s) on the stress images. This is very mild peri-infarct ischemia. Left ventricular systolic function was markedly depressed with global hypokinesis, LVEF At 26%. This is a high risk study, consider further cardiac work-up.  Allergies (April Harrington; 10/03/2017 9:03 AM) Penicillins  facial swelling Anesthesia Extension Tubing *MEDICAL DEVICES AND SUPPLIES*  Low bp  Family History (April Harrington; 10-03-17 9:03 AM) Father  Deceased. at age 4 after hip surgery (blood poisoning). Mother  Deceased. at age 64 from Alzheimer's; had Hypertension. Brother 2  both living-younger brother has h/o irregular heart beat.  Social History (April Harrington; October 03, 2017 9:03 AM) Living Situation  Lives  alone. Number of Children  1. Current tobacco use  Former smoker. quit in 1976 Marital status  Widowed. 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  Medication History (April Harrington; 09/04/2017 9:08 AM) Eliquis (5MG  Tablet, 1 Tablet Oral two times daily, Taken starting 08/20/2017) Active. Fish Oil (1200MG  Capsule, 1 Oral daily) Active. Vitamin C (500MG  Tablet, 1 Oral daily) Active. Metoprolol Tartrate (25MG  Tablet, 1/2 Oral two times daily) Active. AmLODIPine Besylate (10MG  Tablet, 1 Oral daily) Active. Benazepril HCl (40MG  Tablet, 1 Oral daily) Active. HydroCHLOROthiazide (25MG  Tablet, 1 for 2 days Oral then skip a day) Active. Atorvastatin Calcium (40MG  Tablet, 1/2 tab Oral daily) Active. Vision Formula (1 Oral daily) Active. Melatonin (3MG  Tablet ER, 1 Oral daily) Active. Vitamin B 12 (50MCG Tablet, 1 Oral daily) Active. Multivitamin Adults 50+ (1 Oral daily) Active. Tylenol (500MG  Capsule, Oral as needed) Active. Medications Reconciled (verbally with pt; no list or medication present)  Diagnostic Studies History (April Harrington; 09/04/2017 9:07 AM) Coronary Angiogram [08/28/2017]: Echocardiogram [08/08/2017]: 1. Left ventricle cavity is normal in size. Mild concentric hypertrophy of the left ventricle. Normal global wall motion. Visual EF is 55-60%. Calculated EF 55%. 2. Mild (Grade I) aortic regurgitation. 3. Trace mitral regurgitation. 4. Trace tricuspid regurgitation. 5. Mildly dilated ascending aorta, measures 3.8 cm. Nuclear stress test [08/03/2017]: 1. The resting electrocardiogram demonstrated atrial fibrillation, inferior infarct old. Low voltage complexes, cannot exclude anterior infarct old. Stress EKG is nondiagnostic for ischemia as a pharmacologic stress test and occasional PVCs. Stress  symptoms included dyspnea. 2. SPECT images demonstrate large perfusion abnormality of severe intensity in the basal inferior, basal inferolateral, mid inferior, mid inferolateral, apical inferior, apical lateral and apical myocardial wall(s) on the stress images. This is very mild peri-infarct ischemia. Left ventricular systolic function was markedly depressed with global hypokinesis, LVEF At 26%. This is a high risk study, consider further cardiac work-up.    Review of Systems [name] MD; 09/04/2017 4:43 PM)  Note: GENERAL- Feels tired, No fever, chills. CARDIO VASCULAR- No chest pain, Has exertional shortness of breath, no orthopnea or PND. Has palpitation, Has occ. dizziness, no fainting. Has hypertension and h/o high cholesterol. Has mild swelling on legs in evening. No claudication in legs, No cramps. No h/o DVT PULMONARY- No cough, phlegm, wheezing, not feeling congested in chest. GASTROINTESTINAL- No abdominal pain, nausea, vomiting or diarrhea. No dark tarry stools. Normal appetite. No heartburn. ENDOCRINE- No Thyroid problem, No feeling of excessive heat or cold, No polydipsia or polyuria. No Diabetes. NEUROLOGICAL- No focal motor or sensory symptoms, Good coordination. No seizures. MUSCULOSKELETAL- No generalized myalgias or muscle weakness. No joint swelling SKIN- No skin rash, No pruritus HEMATOLOGY- No anemia, petechiae, excessive bruising, epistaxis, GI bleed or any abnormal bleeding.   Vitals [name] MD; 09/04/2017 9:14 AM) 09/04/2017 9:05 AM Weight: 244.38 lb Height: 73.5in Body Surface Area: 2.35 m Body Mass Index: 31.8 kg/m  Pulse: 80 (Irregular)  P.OX: 98% (Room air) BP: 122/72 (Sitting, Left Arm, Standard)       Physical Exam [name] MD; 09/04/2017 4:44 PM) The physical exam findings are as follows: Note:GENERAL APPEARANCE- Alert, Oriented. Well built, mildly obese. HEENT- Unremarkable, fundi were not examined. Patient  wears glasses and dentures. NECK- No JVD. Carotid pulses are 2+, No bruits audible. No thyromegaly. No lymphadenopathy. HEART- Auscultation- Irregular rhythm. Variable S1, S2. No gallops or murmurs audible. CHEST- Normal shape. Normal percussion. Auscultation- Normal breath sounds, No crepitations. No wheezing. ABDOMEN- Palpation- Soft, Nontender. No hepatosplenomegaly. No masses  felt. Auscultation- Normal bowel sounds. No bruits audible. EXTREMITIES- No Clubbing or Cyanosis. No edema on legs or feet. Bilateral varicosities with chronic pigmentation or legs. PERIPHERAL PULSES- Both femoral pulses- 2+, No bruits audible. Both dorsalis pedis pulses- 2+, Both posterior tibial pulses- 1+. Site of catheterization in right radial artery is well healed. The right radial pulse is normal.    Assessment & Plan [name] MD; 09/04/2017 4:45 PM) Persistent atrial fibrillation (I48.1) Story: CHA2DS2-VASc score- 3 with yearly risk of stroke-3.2%. Echo- 08/08/2017 1. Left ventricle cavity is normal in size. Mild concentric hypertrophy of the left ventricle. Normal global wall motion. Visual EF is 55-60%. Calculated EF 55%. 2. Mild (Grade I) aortic regurgitation. 3. Trace mitral regurgitation. 4. Trace tricuspid regurgitation. 5. Mildly dilated ascending aorta, measures 3.8 cm. Current Plans Started Amiodarone HCl 200MG , 1 (one) Tablet daily, #45, 30 days starting 09/04/2017, Ref. x1. Local Order: Take 1 tab. twice a day for 1 week, then once a day. METABOLIC PANEL, BASIC (94076) Nonobstructive atherosclerosis of coronary artery (I25.10) Story: Cardiac cath- 08/28/2017- Minimal luminal irregularities in LAD and circumflex, other arteries were normal. Hypertension, benign (I10) PVC (premature ventricular contraction) (I49.3) Hypercholesterolemia (E78.00) Story: 05/03/2017-cholesterol-143, HDL-40, LDL-80, triglycerides-113. Normal liver enzymes. Obesity (BMI 30-39.9) (E66.9) Laboratory  examination (K08.81) Story: 07/20/2017-TSH is 2.47 Hemoglobin-15.2, hematocrit-42.8, platelets-284 BUN-10, creatinine-0.67. Sodium-137, potassium-4.7.  Note:Cardiac catheterization findings were again explained to the patient and he was reassured.  Ventricular response with atrial fibrillation is controlled. He was advised to continue all the present medications. Patient was explained regarding rhythm control for atrial fibrillation in view of his symptoms. Cardioversion with antiarrhythmic therapy was explained. Patient is agreeable. I have started him on amiodarone and have scheduled him for cardioversion. The procedure of cardioversion and possible risks including risk of stroke, deterioration of the rhythm to ventricular fibrillation and cardiac arrest etc. were explained. He verbalized understanding. His blood pressure is controlled with therapy.  Dietary instructions were again given. Patient was advised to follow low-salt, low-cholesterol diet.  I will see him in follow-up after cardioversion.  CC: Dr. Deland Pretty.  Signed electronically by Despina Hick, MD (09/04/2017 4:47 PM)

## 2017-09-11 ENCOUNTER — Ambulatory Visit (HOSPITAL_COMMUNITY): Payer: Medicare Other | Admitting: Critical Care Medicine

## 2017-09-11 ENCOUNTER — Encounter (HOSPITAL_COMMUNITY)
Admission: RE | Disposition: A | Discharge status: Home / Self Care | Payer: Self-pay | Source: Ambulatory Visit | Attending: Cardiology

## 2017-09-11 ENCOUNTER — Ambulatory Visit (HOSPITAL_COMMUNITY)
Admission: RE | Admit: 2017-09-11 | Discharge: 2017-09-11 | Disposition: A | Discharge status: Home / Self Care | Payer: Medicare Other | Source: Ambulatory Visit | Attending: Cardiology | Admitting: Cardiology

## 2017-09-11 ENCOUNTER — Encounter (HOSPITAL_COMMUNITY): Payer: Self-pay

## 2017-09-11 ENCOUNTER — Other Ambulatory Visit: Payer: Self-pay

## 2017-09-11 DIAGNOSIS — I499 Cardiac arrhythmia, unspecified: Secondary | ICD-10-CM | POA: Diagnosis not present

## 2017-09-11 DIAGNOSIS — Z7901 Long term (current) use of anticoagulants: Secondary | ICD-10-CM | POA: Diagnosis not present

## 2017-09-11 DIAGNOSIS — Z880 Allergy status to penicillin: Secondary | ICD-10-CM | POA: Diagnosis not present

## 2017-09-11 DIAGNOSIS — E7800 Pure hypercholesterolemia, unspecified: Secondary | ICD-10-CM | POA: Diagnosis not present

## 2017-09-11 DIAGNOSIS — I493 Ventricular premature depolarization: Secondary | ICD-10-CM | POA: Diagnosis not present

## 2017-09-11 DIAGNOSIS — Z79899 Other long term (current) drug therapy: Secondary | ICD-10-CM | POA: Insufficient documentation

## 2017-09-11 DIAGNOSIS — J439 Emphysema, unspecified: Secondary | ICD-10-CM | POA: Insufficient documentation

## 2017-09-11 DIAGNOSIS — Z87891 Personal history of nicotine dependence: Secondary | ICD-10-CM | POA: Insufficient documentation

## 2017-09-11 DIAGNOSIS — Z888 Allergy status to other drugs, medicaments and biological substances status: Secondary | ICD-10-CM | POA: Diagnosis not present

## 2017-09-11 DIAGNOSIS — I481 Persistent atrial fibrillation: Secondary | ICD-10-CM | POA: Diagnosis not present

## 2017-09-11 DIAGNOSIS — I4891 Unspecified atrial fibrillation: Secondary | ICD-10-CM | POA: Diagnosis not present

## 2017-09-11 DIAGNOSIS — I10 Essential (primary) hypertension: Secondary | ICD-10-CM | POA: Insufficient documentation

## 2017-09-11 DIAGNOSIS — I351 Nonrheumatic aortic (valve) insufficiency: Secondary | ICD-10-CM | POA: Insufficient documentation

## 2017-09-11 DIAGNOSIS — E785 Hyperlipidemia, unspecified: Secondary | ICD-10-CM | POA: Diagnosis not present

## 2017-09-11 HISTORY — DX: Adverse effect of unspecified anesthetic, initial encounter: T41.45XA

## 2017-09-11 HISTORY — DX: Other complications of anesthesia, initial encounter: T88.59XA

## 2017-09-11 HISTORY — PX: CARDIOVERSION: SHX1299

## 2017-09-11 SURGERY — CARDIOVERSION
Anesthesia: General

## 2017-09-11 MED ORDER — PROPOFOL 10 MG/ML IV BOLUS
INTRAVENOUS | Status: DC | PRN
  Administered 2017-09-11: 20 mg via INTRAVENOUS

## 2017-09-11 MED ORDER — LIDOCAINE 2% (20 MG/ML) 5 ML SYRINGE
INTRAMUSCULAR | Status: DC | PRN
  Administered 2017-09-11: 60 mg via INTRAVENOUS

## 2017-09-11 MED ORDER — SODIUM CHLORIDE 0.9 % IV SOLN
INTRAVENOUS | Status: DC | PRN
  Administered 2017-09-11: 12:00:00 via INTRAVENOUS

## 2017-09-11 MED ORDER — ETOMIDATE 2 MG/ML IV SOLN
INTRAVENOUS | Status: DC | PRN
  Administered 2017-09-11: 10 mg via INTRAVENOUS

## 2017-09-11 NOTE — Interval H&P Note (Signed)
History and Physical Interval Note:  09/11/2017 1:05 PM  Robert Hensley  has presented today for surgery, with the diagnosis of AFIB  The various methods of treatment have been discussed with the patient and family. After consideration of risks, benefits and other options for treatment, the patient has consented to  Procedure(s): CARDIOVERSION (N/A) as a surgical intervention .  The patient's history has been reviewed, patient examined, no change in status, stable for surgery.  I have reviewed the patient's chart and labs.  Questions were answered to the patient's satisfaction.     [place]

## 2017-09-11 NOTE — Anesthesia Preprocedure Evaluation (Addendum)
Anesthesia Evaluation  Patient identified by MRN, date of birth, ID band Patient awake    Reviewed: Allergy & Precautions, H&P , NPO status , Patient's Chart, lab work & pertinent test results  Airway Mallampati: I  TM Distance: >3 FB Neck ROM: Full    Dental  (+) Dental Advisory Given, Upper Dentures, Lower Dentures   Pulmonary COPD, former smoker,    breath sounds clear to auscultation       Cardiovascular hypertension, Pt. on medications + dysrhythmias Atrial Fibrillation  Rhythm:irregular Rate:Normal     Neuro/Psych    GI/Hepatic   Endo/Other    Renal/GU      Musculoskeletal   Abdominal   Peds  Hematology   Anesthesia Other Findings   Reproductive/Obstetrics                            Anesthesia Physical Anesthesia Plan  ASA: III  Anesthesia Plan: General   Post-op Pain Management:    Induction: Intravenous  PONV Risk Score and Plan: 2 and Treatment may vary due to age or medical condition  Airway Management Planned: Mask  Additional Equipment:   Intra-op Plan:   Post-operative Plan:   Informed Consent: I have reviewed the patients History and Physical, chart, labs and discussed the procedure including the risks, benefits and alternatives for the proposed anesthesia with the patient or authorized representative who has indicated his/her understanding and acceptance.   Dental advisory given  Plan Discussed with: Anesthesiologist and CRNA  Anesthesia Plan Comments:        Anesthesia Quick Evaluation

## 2017-09-11 NOTE — CV Procedure (Signed)
Direct current cardioversion:  Indication symptomatic A. Fibrillation.  Procedure: Deep sedation administered and monitored by anesthesia. Synchronized direct current cardioversion performed. Patient was delivered with 120 Joules of electricity X 1 with success to NSR. Patient tolerated the procedure well. No immediate complication noted.   Nigel Mormon, MD [facility] [place] Pager: [phone] Office: [phone] If no answer Cell [phone]

## 2017-09-11 NOTE — Anesthesia Procedure Notes (Signed)
Procedure Name: General with mask airway Date/Time: 09/11/2017 1:06 PM Performed by: Wilburn Cornelia, CRNA Pre-anesthesia Checklist: Patient identified, Emergency Drugs available, Suction available, Patient being monitored and Timeout performed Patient Re-evaluated:Patient Re-evaluated prior to induction Oxygen Delivery Method: Ambu bag Preoxygenation: Pre-oxygenation with 100% oxygen Induction Type: IV induction Placement Confirmation: positive ETCO2,  CO2 detector and breath sounds checked- equal and bilateral Dental Injury: Teeth and Oropharynx as per pre-operative assessment

## 2017-09-11 NOTE — Transfer of Care (Signed)
Immediate Anesthesia Transfer of Care Note  Patient: Robert Hensley  Procedure(s) Performed: CARDIOVERSION (N/A )  Patient Location: Endoscopy Unit  Anesthesia Type:General  Level of Consciousness: awake, alert  and oriented  Airway & Oxygen Therapy: Patient Spontanous Breathing  Post-op Assessment: Report given to RN and Post -op Vital signs reviewed and stable  Post vital signs: Reviewed and stable  Last Vitals:  Vitals:   09/11/17 1135  BP: (!) 151/105  Pulse: 86  Resp: 18  SpO2: 98%    Last Pain: There were no vitals filed for this visit.       Complications: No apparent anesthesia complications

## 2017-09-11 NOTE — Discharge Instructions (Signed)
Electrical Cardioversion, Care After °This sheet gives you information about how to care for yourself after your procedure. Your health care provider may also give you more specific instructions. If you have problems or questions, contact your health care provider. °What can I expect after the procedure? °After the procedure, it is common to have: °· Some redness on the skin where the shocks were given. ° °Follow these instructions at home: °· Do not drive for 24 hours if you were given a medicine to help you relax (sedative). °· Take over-the-counter and prescription medicines only as told by your health care provider. °· Ask your health care provider how to check your pulse. Check it often. °· Rest for 48 hours after the procedure or as told by your health care provider. °· Avoid or limit your caffeine use as told by your health care provider. °Contact a health care provider if: °· You feel like your heart is beating too quickly or your pulse is not regular. °· You have a serious muscle cramp that does not go away. °Get help right away if: °· You have discomfort in your chest. °· You are dizzy or you feel faint. °· You have trouble breathing or you are short of breath. °· Your speech is slurred. °· You have trouble moving an arm or leg on one side of your body. °· Your fingers or toes turn cold or blue. °This information is not intended to replace advice given to you by your health care provider. Make sure you discuss any questions you have with your health care provider. °Document Released: 04/30/2013 Document Revised: 02/11/2016 Document Reviewed: 01/14/2016 °Elsevier Interactive Patient Education © 2018 [employer] ° °

## 2017-09-12 NOTE — Anesthesia Postprocedure Evaluation (Signed)
Anesthesia Post Note  Patient: Robert Hensley  Procedure(s) Performed: CARDIOVERSION (N/A )     Patient location during evaluation: Endoscopy Anesthesia Type: General Level of consciousness: awake and alert Pain management: pain level controlled Vital Signs Assessment: post-procedure vital signs reviewed and stable Respiratory status: spontaneous breathing, nonlabored ventilation, respiratory function stable and patient connected to nasal cannula oxygen Cardiovascular status: blood pressure returned to baseline and stable Postop Assessment: no apparent nausea or vomiting Anesthetic complications: no    Last Vitals:  Vitals:   09/11/17 1320 09/11/17 1330  BP: (!) 147/57 132/63  Pulse: 64 62  Resp: 17 15  SpO2: 99% 98%    Last Pain:  Vitals:   09/11/17 1315  TempSrc: Oral                 Ronasia Isola S

## 2017-09-18 DIAGNOSIS — I2510 Atherosclerotic heart disease of native coronary artery without angina pectoris: Secondary | ICD-10-CM | POA: Diagnosis not present

## 2017-09-18 DIAGNOSIS — I4891 Unspecified atrial fibrillation: Secondary | ICD-10-CM | POA: Diagnosis not present

## 2017-09-18 DIAGNOSIS — I10 Essential (primary) hypertension: Secondary | ICD-10-CM | POA: Diagnosis not present

## 2017-09-18 DIAGNOSIS — I351 Nonrheumatic aortic (valve) insufficiency: Secondary | ICD-10-CM | POA: Diagnosis not present

## 2017-12-25 DIAGNOSIS — I2510 Atherosclerotic heart disease of native coronary artery without angina pectoris: Secondary | ICD-10-CM | POA: Diagnosis not present

## 2017-12-25 DIAGNOSIS — I4891 Unspecified atrial fibrillation: Secondary | ICD-10-CM | POA: Diagnosis not present

## 2017-12-25 DIAGNOSIS — I351 Nonrheumatic aortic (valve) insufficiency: Secondary | ICD-10-CM | POA: Diagnosis not present

## 2017-12-25 DIAGNOSIS — I10 Essential (primary) hypertension: Secondary | ICD-10-CM | POA: Diagnosis not present

## 2018-04-29 DIAGNOSIS — I4891 Unspecified atrial fibrillation: Secondary | ICD-10-CM | POA: Diagnosis not present

## 2018-04-29 DIAGNOSIS — I10 Essential (primary) hypertension: Secondary | ICD-10-CM | POA: Diagnosis not present

## 2018-04-29 DIAGNOSIS — I2510 Atherosclerotic heart disease of native coronary artery without angina pectoris: Secondary | ICD-10-CM | POA: Diagnosis not present

## 2018-04-29 DIAGNOSIS — I351 Nonrheumatic aortic (valve) insufficiency: Secondary | ICD-10-CM | POA: Diagnosis not present

## 2018-05-14 DIAGNOSIS — I10 Essential (primary) hypertension: Secondary | ICD-10-CM | POA: Diagnosis not present

## 2018-05-14 DIAGNOSIS — E7800 Pure hypercholesterolemia, unspecified: Secondary | ICD-10-CM | POA: Diagnosis not present

## 2018-05-17 DIAGNOSIS — M539 Dorsopathy, unspecified: Secondary | ICD-10-CM | POA: Diagnosis not present

## 2018-05-17 DIAGNOSIS — Z0001 Encounter for general adult medical examination with abnormal findings: Secondary | ICD-10-CM | POA: Diagnosis not present

## 2018-10-09 DIAGNOSIS — H2513 Age-related nuclear cataract, bilateral: Secondary | ICD-10-CM | POA: Diagnosis not present

## 2018-10-09 DIAGNOSIS — H40033 Anatomical narrow angle, bilateral: Secondary | ICD-10-CM | POA: Diagnosis not present

## 2018-10-12 NOTE — Progress Notes (Signed)
Subjective:  Primary Physician:  Deland Pretty, MD  Patient ID: Robert Hensley, male    DOB: September 13, 1940, 78 y.o.   MRN: [id number]  Chief Complaint  Patient presents with  . Atrial Fibrillation    HPI: ROBIN PAFFORD  is a 78 y.o. male , comes for follow-up for Atrial fibrillation. Patient was successfully cardioverted to sinus rhythm on 09/11/2017. He had cardiac cath. on 08/28/2017-there were minimal luminal irregularities in LAD and circumflex, other arteries were normal.  He is here on 6 month office visit. He is presently doing well without any complaints. Patient has history of palpitation like feeling of skipping in the heart off and on for many years that are stable. He has chronic PVCs. Tolerating medications well.   He has mild chronic exertional dyspnea from COPD. No history of orthopnea or PND. No complaints of chest pain, tightness or heaviness. He has occasional mild swelling in the legs in the evening. He has bilateral varicosities. No history of leg claudication.  Patient has hypertension and hypercholesterolemia. No history of diabetes. He does not smoke. He has mild obesity. He usually walks 1 mile per day.  No history of thyroid problems.No history of TIA or CVA. No history of GI bleed, hematuria or any other bleeding.     Past Medical History:  Diagnosis Date  . A-fib ([place])   . Blood transfusion   . Complication of anesthesia   . Diverticulitis   . 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[place])   . GI bleed   . Hyperlipidemia   . Hypertension   . Pneumonia   . Prostate cancer [facility])     Past Surgical History:  Procedure Laterality Date  . CARDIOVERSION N/A 09/11/2017   Procedure: CARDIOVERSION;  Surgeon: Nigel Mormon, MD;  Location: [facility] ENDOSCOPY;  Service: Cardiovascular;  Laterality: N/A;  . cyst removed from back    . cyst removed from right knee    . LEFT HEART CATH AND CORONARY ANGIOGRAPHY N/A 08/28/2017   Procedure: LEFT HEART CATH AND CORONARY ANGIOGRAPHY;   Surgeon: Nigel Mormon, MD;  Location: [facility];  Service: Cardiovascular;  Laterality: N/A;  . PROSTATECTOMY    . TESTICLE REMOVAL     Right  . TUMOR REMOVAL  1976   Between lung and chest wall    Social History   Socioeconomic History  . Marital status: Legally Separated    Spouse name: Not on file  . Number of children: 1  . Years of education: Not on file  . Highest education level: Not on file  Occupational History  . Occupation: Retired    Fish farm manager: RETIRED  Social Needs  . Financial resource strain: Not on file  . Food insecurity:    Worry: Not on file    Inability: Not on file  . Transportation needs:    Medical: Not on file    Non-medical: Not on file  Tobacco Use  . Smoking status: Former Smoker    Last attempt to quit: 09/22/1974    Years since quitting: 44.0  . Smokeless tobacco: Former Systems developer    Types: [place] date: 07/24/1994  Substance and Sexual Activity  . Alcohol use: No  . Drug use: No  . Sexual activity: Not on file  Lifestyle  . Physical activity:    Days per week: Not on file    Minutes per session: Not on file  . Stress: Not on file  Relationships  . Social connections:  Talks on phone: Not on file    Gets together: Not on file    Attends religious service: Not on file    Active member of club or organization: Not on file    Attends meetings of clubs or organizations: Not on file    Relationship status: Not on file  . Intimate partner violence:    Fear of current or ex partner: Not on file    Emotionally abused: Not on file    Physically abused: Not on file    Forced sexual activity: Not on file  Other Topics Concern  . Not on file  Social History Narrative  . Not on file    Current Outpatient Medications on File Prior to Visit  Medication Sig Dispense Refill  . acetaminophen (TYLENOL) 500 MG tablet Take 250-500 mg by mouth 2 (two) times daily as needed for moderate pain or headache.     Marland Kitchen amiodarone (PACERONE)  200 MG tablet Take 100 mg by mouth See admin instructions. Take 200 mg by mouth twice daily for 7 days then take 200 mgs once daily     . amLODipine (NORVASC) 10 MG tablet Take 10 mg by mouth daily.    Marland Kitchen apixaban (ELIQUIS) 5 MG TABS tablet Take 5 mg by mouth 2 (two) times daily.    Marland Kitchen atorvastatin (LIPITOR) 40 MG tablet Take 20 mg by mouth daily.    . B Complex Vitamins (B COMPLEX-B12 PO) Take 1 tablet by mouth daily.    . benazepril (LOTENSIN) 40 MG tablet Take 40 mg by mouth daily.    . cetirizine (ZYRTEC) 10 MG tablet Take 10 mg by mouth daily.    . hydrochlorothiazide (HYDRODIURIL) 25 MG tablet Take 25 mg by mouth daily. Take 25 mg by mouth once daily for 2 days then stop for 1 day, then repeat    . ipratropium-albuterol (DUONEB) 0.5-2.5 (3) MG/3ML SOLN Take 3 mLs by nebulization every 6 (six) hours as needed (for shortness of breath or wheezing).    . Melatonin 3 MG CAPS Take 3 mg by mouth at bedtime.     . metoprolol tartrate (LOPRESSOR) 25 MG tablet Take 12.5 mg by mouth 2 (two) times daily.     . Multiple Vitamin (MULTIVITAMIN) tablet Take 1 tablet by mouth daily.    . Multiple Vitamins-Minerals (EQ VISION FORMULA 50+ PO) Take 1 capsule by mouth daily.    . Omega-3 Fatty Acids (FISH OIL) 1200 MG CAPS Take 1,200 mg by mouth daily.     . sodium chloride (OCEAN) 0.65 % SOLN nasal spray Place 1 spray into both nostrils as needed for congestion.    . vitamin C (ASCORBIC ACID) 500 MG tablet Take 500 mg by mouth daily.      No current facility-administered medications on file prior to visit.     Review of Systems  Constitutional: Negative for fever and malaise/fatigue.  HENT: Negative for nosebleeds.   Eyes: Negative for blurred vision.  Respiratory: Positive for shortness of breath. Negative for cough.   Cardiovascular: Positive for palpitations (occasional). Negative for chest pain, claudication, leg swelling and PND.  Gastrointestinal: Negative for abdominal pain, blood in stool, nausea  and vomiting.  Genitourinary: Negative for dysuria.  Musculoskeletal: Negative for myalgias.  Skin: Negative for itching and rash.  Neurological: Negative for dizziness, loss of consciousness and headaches.  Endo/Heme/Allergies: Does not bruise/bleed easily.  Psychiatric/Behavioral: The patient is not nervous/anxious.        Objective:  Blood pressure Marland Kitchen)  164/66, pulse 62, height 6\' 2"  (1.88 m), weight 250 lb (113.4 kg), SpO2 97 %. Body mass index is 32.1 kg/m.  Physical Exam  Constitutional: He is oriented to person, place, and time. He appears well-developed and well-nourished.  Mildly obese.  HENT:  Head: Normocephalic and atraumatic.  Eyes: Pupils are equal, round, and reactive to light.  Neck: No thyromegaly present.  Cardiovascular: Normal rate, regular rhythm and normal heart sounds. Exam reveals no gallop.  No murmur heard. Pulses:      Carotid pulses are 2+ on the right side and 2+ on the left side.      Dorsalis pedis pulses are 2+ on the right side and 2+ on the left side.       Posterior tibial pulses are 1+ on the right side and 1+ on the left side.  Pulmonary/Chest: He has no wheezes. He has no rales.  Abdominal: Soft. Bowel sounds are normal. He exhibits no distension and no mass. There is no abdominal tenderness.  Musculoskeletal: Normal range of motion.        General: No edema.     Comments: Bilateral varicosities and spider veins are seen on legs.  Lymphadenopathy:    He has no cervical adenopathy.  Neurological: He is alert and oriented to person, place, and time.  Skin: Skin is warm and dry.  Psychiatric: He has a normal mood and affect.  Vitals reviewed.   CARDIAC STUDIES:  Echo- 08/08/2017 1. Left ventricle cavity is normal in size. Mild concentric hypertrophy of the left ventricle. Normal global wall motion. Visual EF is 55-60%. Calculated EF 55%. 2. Mild (Grade I) aortic regurgitation. 3. Trace mitral regurgitation. 4. Trace tricuspid  regurgitation. 5. Mildly dilated ascending aorta, measures 3.8 cm.  Labs 07/20/2017-TSH is 2.47 Hemoglobin-15.2, hematocrit-42.8, platelets-284 BUN-10, creatinine-0.67.  Sodium-137, potassium-4.7.  EKG 10/14/2018: Sinus bradycardia at 58 bpm, left atrial abnormality, first degree AV block, left anterior hemiblock. Poor R wave progression. Normal QT interval.   Assessment & Recommendations:  Atrial fibrillation status post cardioversion [facility]) - 09/11/2017- Successful sync cardioversion 120 J. Patient has remained in sinus rhythm since then.CHA2DS2-VASc score- 3 with yearly risk of stroke-3.2%. - Plan: EKG 12-Lead, CANCELED: EKG 12-Lead  Essential hypertension  Hypercholesterolemia  Nonobstructive atherosclerosis of coronary artery -  Cardiac cath- 08/28/2017- Minimal luminal irregularities in LAD and circumflex, other arteries were normal.  Mild aortic regurgitation  PVC's (premature ventricular contractions)   Recommendation:   Patient is here on a 41-month office visit and follow-up for atrial fibrillation.  He continues to maintain sinus rhythm with amiodarone 100 mg daily.  Tolerating anticoagulation well without any bleeding diathesis.  Except for dyspnea on exertion that is chronic and related to emphysema, no complaints today.  He remains active with walking on the treadmill several times a day and also volunteering at [employer].  Blood pressure was initially elevated in our office, but on recheck had improved.  He monitors closely at home and generally in the 616 systolic.  We will continue the same.    He reports labs with PCP in fall 2019 were stable, will request for our records.  He is on Lipitor for hyperlipidemia and also nonobstructive CAD.  No symptoms of angina.  No clinical evidence of heart failure. Encouraged him to continue to exercise regularly and limit salt in his diet.  I will see him back in 6 months or sooner if problems.     Miquel Dunn, MSN, APRN,  FNP-C 10/14/2018, 10:06  AM [facility]. [place] Office: [phone]

## 2018-10-14 ENCOUNTER — Other Ambulatory Visit: Payer: Self-pay

## 2018-10-14 ENCOUNTER — Ambulatory Visit (INDEPENDENT_AMBULATORY_CARE_PROVIDER_SITE_OTHER): Payer: Medicare Other | Admitting: Cardiology

## 2018-10-14 ENCOUNTER — Encounter: Payer: Self-pay | Admitting: Cardiology

## 2018-10-14 VITALS — BP 138/72 | HR 62 | Ht 74.0 in | Wt 250.0 lb

## 2018-10-14 DIAGNOSIS — I351 Nonrheumatic aortic (valve) insufficiency: Secondary | ICD-10-CM

## 2018-10-14 DIAGNOSIS — I2510 Atherosclerotic heart disease of native coronary artery without angina pectoris: Secondary | ICD-10-CM

## 2018-10-14 DIAGNOSIS — G44209 Tension-type headache, unspecified, not intractable: Secondary | ICD-10-CM | POA: Diagnosis not present

## 2018-10-14 DIAGNOSIS — E7800 Pure hypercholesterolemia, unspecified: Secondary | ICD-10-CM

## 2018-10-14 DIAGNOSIS — I4891 Unspecified atrial fibrillation: Secondary | ICD-10-CM | POA: Diagnosis not present

## 2018-10-14 DIAGNOSIS — I493 Ventricular premature depolarization: Secondary | ICD-10-CM

## 2018-10-14 DIAGNOSIS — I10 Essential (primary) hypertension: Secondary | ICD-10-CM

## 2018-10-15 DIAGNOSIS — H00025 Hordeolum internum left lower eyelid: Secondary | ICD-10-CM | POA: Diagnosis not present

## 2018-10-16 ENCOUNTER — Encounter: Payer: Self-pay | Admitting: Cardiology

## 2019-01-09 ENCOUNTER — Other Ambulatory Visit: Payer: Self-pay

## 2019-01-09 NOTE — Patient Outreach (Signed)
[facility]) Care Management  01/09/2019  Robert Hensley 1941/04/08 [id number]   Medication Adherence call to Robert Hensley HIPPA Compliant Voice message left with a call back number. Robert Hensley is showing past due on Benazepril 40 mg and Atorvastatin 40 mg under [place].   [place] Management Direct Dial [phone]  Fax [phone] [email or web address]

## 2019-01-17 ENCOUNTER — Other Ambulatory Visit: Payer: Self-pay

## 2019-01-17 MED ORDER — APIXABAN 5 MG PO TABS: 5.0000 mg | ORAL_TABLET | Freq: Two times a day (BID) | ORAL | 1 refills | Status: DC

## 2019-01-22 ENCOUNTER — Other Ambulatory Visit: Payer: Self-pay

## 2019-01-22 MED ORDER — APIXABAN 5 MG PO TABS: 5.0000 mg | ORAL_TABLET | Freq: Two times a day (BID) | ORAL | 1 refills | Status: DC

## 2019-03-20 NOTE — Progress Notes (Signed)
Primary Physician:  Deland Pretty, MD   Patient ID: Robert Hensley, male    DOB: 1941/04/15, 78 y.o.   MRN: [id number]  Subjective:    Chief Complaint  Patient presents with  . Atrial Fibrillation    6 month f/u    HPI: Robert Hensley  is a 78 y.o. male  with paroxysmal atrial fibrillation, COPD, hypertension and hyperlipidemia.   He was successfully cardioverted to sinus rhythm on 09/11/2017. He had cardiac cath. on 08/28/2017-there were minimal luminal irregularities in LAD and circumflex, other arteries were normal.  He is here on 6 month office visit. He is presently doing well without any complaints. Patient has history of palpitation like feeling of skipping in the heart off and on for many years that are stable. He has chronic PVCs. Tolerating medications well.   He has mild chronic exertional dyspnea from COPD.No bothered by this. No history of orthopnea or PND. No complaints of chest pain, tightness or heaviness. No history of leg claudication.  No history of diabetes. He does not smoke. He has mild obesity. He usually walks 1 mile per day and tolerates this well..  No history of thyroid problems.No history of TIA or CVA. No history of GI bleed, hematuria or any other bleeding  Past Medical History:  Diagnosis Date  . A-fib ([place])   . Blood transfusion   . Complication of anesthesia   . Diverticulitis   . 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[place])   . GI bleed   . Hyperlipidemia   . Hypertension   . Pneumonia   . Prostate cancer [facility])     Past Surgical History:  Procedure Laterality Date  . CARDIOVERSION N/A 09/11/2017   Procedure: CARDIOVERSION;  Surgeon: Nigel Mormon, MD;  [facility];  Service: Cardiovascular;  Laterality: N/A;  . cyst removed from back    . cyst removed from right knee    . LEFT HEART CATH AND CORONARY ANGIOGRAPHY N/A 08/28/2017   Procedure: LEFT HEART CATH AND CORONARY ANGIOGRAPHY;  Surgeon: Nigel Mormon, MD;  Location: [facility];  Service: Cardiovascular;  Laterality: N/A;  . PROSTATECTOMY    . TESTICLE REMOVAL     Right  . TUMOR REMOVAL  1976   Between lung and chest wall    Social History   Socioeconomic History  . Marital status: Legally Separated    Spouse name: Not on file  . Number of children: 1  . Years of education: Not on file  . Highest education level: Not on file  Occupational History  . Occupation: Retired    Fish farm manager: RETIRED  Social Needs  . Financial resource strain: Not on file  . Food insecurity    Worry: Not on file    Inability: Not on file  . Transportation needs    Medical: Not on file    Non-medical: Not on file  Tobacco Use  . Smoking status: Former Smoker    Quit date: 09/22/1974    Years since quitting: 44.5  . Smokeless tobacco: Former Systems developer    Types: [place] date: 07/24/1994  Substance and Sexual Activity  . Alcohol use: No  . Drug use: No  . Sexual activity: Not on file  Lifestyle  . Physical activity    Days per week: Not on file    Minutes per session: Not on file  . Stress: Not on file  Relationships  . Social Herbalist on phone:  Not on file    Gets together: Not on file    Attends religious service: Not on file    Active member of club or organization: Not on file    Attends meetings of clubs or organizations: Not on file    Relationship status: Not on file  . Intimate partner violence    Fear of current or ex partner: Not on file    Emotionally abused: Not on file    Physically abused: Not on file    Forced sexual activity: Not on file  Other Topics Concern  . Not on file  Social History Narrative  . Not on file    Review of Systems  Constitution: Negative for decreased appetite, malaise/fatigue, weight gain and weight loss.  Eyes: Negative for visual disturbance.  Cardiovascular: Positive for palpitations (occasional). Negative for chest pain, claudication, dyspnea on exertion, leg swelling, orthopnea and syncope.   Respiratory: Positive for shortness of breath. Negative for hemoptysis and wheezing.   Endocrine: Negative for cold intolerance and heat intolerance.  Hematologic/Lymphatic: Does not bruise/bleed easily.  Skin: Negative for nail changes.  Musculoskeletal: Negative for muscle weakness and myalgias.  Gastrointestinal: Negative for abdominal pain, change in bowel habit, nausea and vomiting.  Neurological: Negative for difficulty with concentration, dizziness, focal weakness and headaches.  Psychiatric/Behavioral: Negative for altered mental status and suicidal ideas.  All other systems reviewed and are negative.     Objective:  Blood pressure (!) 141/70, pulse 60, temperature (!) 97.3 F (36.3 C), height 6\' 2"  (1.88 m), weight 246 lb (111.6 kg), SpO2 96 %. Body mass index is 31.58 kg/m.    Physical Exam  Constitutional: He is oriented to person, place, and time. Vital signs are normal. He appears well-developed and well-nourished.  HENT:  Head: Normocephalic and atraumatic.  Neck: Normal range of motion.  Cardiovascular: Normal rate, regular rhythm, normal heart sounds and intact distal pulses.  Pulses:      Dorsalis pedis pulses are 2+ on the right side and 2+ on the left side.       Posterior tibial pulses are 1+ on the right side and 1+ on the left side.  Pulmonary/Chest: Effort normal and breath sounds normal. No accessory muscle usage. No respiratory distress.  Abdominal: Soft. Bowel sounds are normal.  Musculoskeletal: Normal range of motion.  Neurological: He is alert and oriented to person, place, and time.  Skin: Skin is warm and dry.  Vitals reviewed.  Radiology: No results found.  Laboratory examination:    CMP Latest Ref Rng & Units 11/01/2011 04/19/2011 04/19/2011  Glucose 70 - 99 mg/dL 105(H) 131(H) 121(H)  BUN 6 - 23 mg/dL 14 14 14   Creatinine 0.4 - 1.5 mg/dL 0.8 0.71 0.73  Sodium 135 - 145 mEq/L 137 142 137  Potassium 3.5 - 5.1 mEq/L 4.7 4.3 4.2  Chloride 96 -  112 mEq/L 100 102 101  CO2 19 - 32 mEq/L 29 31 27   Calcium 8.4 - 10.5 mg/dL 9.7 9.4 9.1  Total Protein 6.0 - 8.3 g/dL 8.1 7.7 6.9  Total Bilirubin 0.3 - 1.2 mg/dL 0.5 0.5 0.7  Alkaline Phos 39 - 117 U/L 68 74 70  AST 0 - 37 U/L 31 18 17   ALT 0 - 53 U/L 43 19 16   CBC Latest Ref Rng & Units 11/01/2011 04/19/2011 04/19/2011  WBC 4.5 - 10.5 K/uL 8.5 15.8(H) 12.1(H)  Hemoglobin 13.0 - 17.0 g/dL 14.6 14.5 13.6  Hematocrit 39.0 - 52.0 % 42.8 39.8 39.1  Platelets 150.0 - 400.0 K/uL 263.0 294 199   Lipid Panel  No results found for: CHOL, TRIG, HDL, CHOLHDL, VLDL, LDLCALC, LDLDIRECT HEMOGLOBIN A1C No results found for: HGBA1C, MPG TSH No results for input(s): TSH in the last 8760 hours.  PRN Meds:. There are no discontinued medications. Current Meds  Medication Sig  . acetaminophen (TYLENOL) 500 MG tablet Take 250-500 mg by mouth 2 (two) times daily as needed for moderate pain or headache.   Marland Kitchen amiodarone (PACERONE) 200 MG tablet Take 100 mg by mouth See admin instructions. Take 200 mg by mouth twice daily for 7 days then take 200 mgs once daily   . amLODipine (NORVASC) 10 MG tablet Take 10 mg by mouth daily.  Marland Kitchen apixaban (ELIQUIS) 5 MG TABS tablet Take 1 tablet (5 mg total) by mouth 2 (two) times daily.  Marland Kitchen atorvastatin (LIPITOR) 40 MG tablet Take 20 mg by mouth daily.  . B Complex Vitamins (B COMPLEX-B12 PO) Take 1 tablet by mouth daily.  . benazepril (LOTENSIN) 40 MG tablet Take 40 mg by mouth daily.  . cetirizine (ZYRTEC) 10 MG tablet Take 10 mg by mouth daily.  . hydrochlorothiazide (HYDRODIURIL) 25 MG tablet Take 25 mg by mouth daily. Take 25 mg by mouth once daily for 2 days then stop for 1 day, then repeat  . ipratropium-albuterol (DUONEB) 0.5-2.5 (3) MG/3ML SOLN Take 3 mLs by nebulization every 6 (six) hours as needed (for shortness of breath or wheezing).  . Melatonin 3 MG CAPS Take 3 mg by mouth at bedtime.   . metoprolol tartrate (LOPRESSOR) 25 MG tablet Take 12.5 mg by mouth 2  (two) times daily.   . Multiple Vitamin (MULTIVITAMIN) tablet Take 1 tablet by mouth daily.  . Multiple Vitamins-Minerals (EQ VISION FORMULA 50+ PO) Take 1 capsule by mouth daily.  . Omega-3 Fatty Acids (FISH OIL) 1200 MG CAPS Take 1,200 mg by mouth daily.   . sodium chloride (OCEAN) 0.65 % SOLN nasal spray Place 1 spray into both nostrils as needed for congestion.  . vitamin C (ASCORBIC ACID) 500 MG tablet Take 500 mg by mouth daily.     Cardiac Studies:   Echo- 08/08/2017 1. Left ventricle cavity is normal in size. Mild concentric hypertrophy of the left ventricle. Normal global wall motion. Visual EF is 55-60%. Calculated EF 55%. 2. Mild (Grade I) aortic regurgitation. 3. Trace mitral regurgitation. 4. Trace tricuspid regurgitation. 5. Mildly dilated ascending aorta, measures 3.8 cm.  Assessment:   Paroxysmal atrial fibrillation (HCC) - Plan: EKG 12-Lead  Essential hypertension  Hypercholesterolemia  Nonobstructive atherosclerosis of coronary artery  Right carotid bruit  EKG 03/21/2019: Sinus bradycardia at 60 bpm, 1 PVC, left atrial abnormality, first degree AV block, left anterior hemiblock. Poor R wave progression. Normal QT interval.   Recommendations:   He was here for 6 month office visit follow-up for paroxysmal atrial fibrillation.  He continues to maintain sinus rhythm with low dose amiodarone, will continue same.  He reports that he is doing very well, but no changes in symptoms.  Has stable dyspnea on exertion and occasional palpitations.  He does have one PVC on his EXAM and EKG today.  He is on appropriate medical therapy.  Tolerating anticoagulation well, no bleeding diathesis reported.  He monitors his blood pressure at home and generally in the [id number] systolic range, blood pressure was elevated in our office.  He will continue to monitor at home.  He is scheduled for annual physical with PCP in the  next few months and will have labs and tests for our records at  that time.  He is on Lipitor for hyperlipidemia and also nonobstructive CAD.  No symptoms of angina.  He did previously have mild aortic root dilation at 3.8 cm, he will need repeat echocardiogram after his next follow-up.  He is also noted to have right carotid bruit on exam today, which has not previously been noted.  We'll obtain carotid duplex for surveillance and will notify him of results.  He continues to walk regularly, and encouraged him to continue with this.  We'll continue with 6 month follow-ups, but encouraged him to contact me sooner if needed.  Miquel Dunn, MSN, APRN, FNP-C [facility]. [place] Office: [phone] Fax: [phone]

## 2019-03-21 ENCOUNTER — Ambulatory Visit (INDEPENDENT_AMBULATORY_CARE_PROVIDER_SITE_OTHER): Payer: Medicare Other | Admitting: Cardiology

## 2019-03-21 ENCOUNTER — Encounter: Payer: Self-pay | Admitting: Cardiology

## 2019-03-21 VITALS — BP 141/70 | HR 60 | Temp 97.3°F | Ht 74.0 in | Wt 246.0 lb

## 2019-03-21 DIAGNOSIS — R0989 Other specified symptoms and signs involving the circulatory and respiratory systems: Secondary | ICD-10-CM | POA: Diagnosis not present

## 2019-03-21 DIAGNOSIS — I10 Essential (primary) hypertension: Secondary | ICD-10-CM

## 2019-03-21 DIAGNOSIS — E7800 Pure hypercholesterolemia, unspecified: Secondary | ICD-10-CM | POA: Diagnosis not present

## 2019-03-21 DIAGNOSIS — I480 Paroxysmal atrial fibrillation: Secondary | ICD-10-CM

## 2019-03-21 DIAGNOSIS — I2510 Atherosclerotic heart disease of native coronary artery without angina pectoris: Secondary | ICD-10-CM | POA: Diagnosis not present

## 2019-04-21 ENCOUNTER — Ambulatory Visit: Payer: Medicare Other | Admitting: Cardiology

## 2019-04-23 DIAGNOSIS — Z23 Encounter for immunization: Secondary | ICD-10-CM | POA: Diagnosis not present

## 2019-05-20 DIAGNOSIS — E7800 Pure hypercholesterolemia, unspecified: Secondary | ICD-10-CM | POA: Diagnosis not present

## 2019-05-20 DIAGNOSIS — I10 Essential (primary) hypertension: Secondary | ICD-10-CM | POA: Diagnosis not present

## 2019-05-23 DIAGNOSIS — J449 Chronic obstructive pulmonary disease, unspecified: Secondary | ICD-10-CM | POA: Diagnosis not present

## 2019-05-23 DIAGNOSIS — M109 Gout, unspecified: Secondary | ICD-10-CM | POA: Diagnosis not present

## 2019-05-23 DIAGNOSIS — Z0000 Encounter for general adult medical examination without abnormal findings: Secondary | ICD-10-CM | POA: Diagnosis not present

## 2019-06-12 ENCOUNTER — Other Ambulatory Visit: Payer: Self-pay | Admitting: Cardiology

## 2019-07-25 ENCOUNTER — Other Ambulatory Visit: Payer: Self-pay | Admitting: Cardiology

## 2019-07-28 NOTE — Telephone Encounter (Signed)
Can this be filled ?

## 2019-09-01 ENCOUNTER — Other Ambulatory Visit: Payer: Self-pay | Admitting: Cardiology

## 2019-09-03 NOTE — Telephone Encounter (Signed)
refill 

## 2019-09-11 ENCOUNTER — Other Ambulatory Visit: Payer: Medicare Other

## 2019-09-19 ENCOUNTER — Ambulatory Visit: Payer: Medicare Other

## 2019-09-19 ENCOUNTER — Other Ambulatory Visit: Payer: Self-pay

## 2019-09-19 DIAGNOSIS — I6523 Occlusion and stenosis of bilateral carotid arteries: Secondary | ICD-10-CM | POA: Diagnosis not present

## 2019-09-19 DIAGNOSIS — R0989 Other specified symptoms and signs involving the circulatory and respiratory systems: Secondary | ICD-10-CM

## 2019-09-20 ENCOUNTER — Other Ambulatory Visit: Payer: Self-pay | Admitting: Cardiology

## 2019-09-20 DIAGNOSIS — I6523 Occlusion and stenosis of bilateral carotid arteries: Secondary | ICD-10-CM

## 2019-09-21 NOTE — Progress Notes (Addendum)
Primary Physician/Referring:  Deland Pretty, MD  Patient ID: Robert Hensley, male    DOB: [birth year], 79 y.o.   MRN: [id number]  Chief Complaint  Patient presents with  . Hypertension  . PAF  . Follow-up    6 month   HPI:    Robert Hensley  is a 79 y.o. Caucasian male with paroxysmal atrial fibrillation S/P DCCV in 2019, asymptomatic bilateral carotid artery stenosis, hypertension, hyperlipidemia, COPD, no significant coronary disease by angiography on 08/28/2017 presents here for a 7-monthoffice visit.  He also has chronic PVCs and feels occasional palpitations.  He now presents for a 63-monthffice visit.  He is presently doing well and remains asymptomatic except for occasional palpitations.  Past Medical History:  Diagnosis Date  . [place]fib ([facility]  . Blood transfusion   . Complication of anesthesia   . Diverticulitis   . 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[place]  . GI bleed   . Hyperlipidemia   . Hypertension   . Pneumonia   . [facility]   Past Surgical History:  Procedure Laterality Date  . CARDIOVERSION N/A 09/11/2017   Procedure: CARDIOVERSION;  Surgeon: PaNigel MormonMD;  [facility];  Service: Cardiovascular;  Laterality: N/A;  . cyst removed from back    . cyst removed from right knee    . LEFT HEART CATH AND CORONARY ANGIOGRAPHY N/A 08/28/2017   Procedure: LEFT HEART CATH AND CORONARY ANGIOGRAPHY;  Surgeon: PaNigel MormonMD;  [facility];  Service: Cardiovascular;  Laterality: N/A;  . PROSTATECTOMY    . TESTICLE REMOVAL     Right  . TUMOR REMOVAL  1976   Between lung and chest wall   Family History  Problem Relation Age of Onset  . Liver cancer Sister   . Cancer Brother   . Heart failure Maternal Aunt   . Hypertension Maternal Aunt     Social History   Tobacco Use  . Smoking status: Former Smoker    Packs/day: 1.00    Years: 25.00    Pack years: 25.00    Types: Cigarettes    Quit date: 09/22/1974    Years since  quitting: 45.0  . Smokeless tobacco: Former UsSystems developer  Types: Chew    Quit date: 07/24/1994  Substance Use Topics  . Alcohol use: No   ROS  Review of Systems  Cardiovascular: Positive for palpitations. Negative for chest pain, dyspnea on exertion and leg swelling.  Gastrointestinal: Negative for melena.   Objective  Blood pressure 130/72, pulse 61, temperature 98.2 F (36.8 C), height _0  (1.88 m), weight 237 lb (107.5 kg), SpO2 98 %.  Vitals with BMI 09/22/2019 09/22/2019 03/21/2019  Height - _1  _2   Weight - 237 lbs 246 lbs  BMI - 3079.72182.06Systolic [id number] 72 73 70  Pulse 61 59 60     Physical Exam  Cardiovascular: Normal rate, regular rhythm, normal heart sounds and intact distal pulses. Exam reveals no gallop.  No murmur heard. No leg edema, no JVD.  Pulmonary/Chest: Effort normal and breath sounds normal.  Abdominal: Soft. Bowel sounds are normal.   Laboratory examination:    Ref Range & Units 1 d ago  TSH 0.450 - 4.500 uIU/mL 2.400     External labs:   Labs 05/20/2019: Hb 13.8/HCT 39.3, platelets 211.  Serum glucose 93 mg, BUN 11, creatinine 0.86, EGFR >80 mL.  Potassium 4.8.  CMP normal.  Total cholesterol 120, triglycerides 84, HDL 44, LDL 59.  Medications and allergies   Allergies  Allergen Reactions  . Penicillins Anaphylaxis and Other (See Comments)    "throat swelling with synthetic penicillins" Has patient had a PCN reaction causing immediate rash, facial/tongue/throat swelling, SOB or lightheadedness with hypotension: No Has patient had a PCN reaction causing severe rash involving mucus membranes or skin necrosis: No Has patient had a PCN reaction that required hospitalization: Yes Has patient had a PCN reaction occurring within the last 10 years: No If all of the above answers are "NO", then may proceed with Cephalosporin use.   . Other Other (See Comments)    General Anesthesia - BP drops      Current Outpatient Medications   Medication Instructions  . acetaminophen (TYLENOL) 250-500 mg, Oral, 2 times daily PRN  . amLODipine (NORVASC) 10 mg, Oral, Daily  . atorvastatin (LIPITOR) 20 mg, Oral, Daily  . B Complex Vitamins (B COMPLEX-B12 PO) 1 tablet, Oral, Daily  . benazepril (LOTENSIN) 40 mg, Oral, Daily  . ELIQUIS 5 MG TABS tablet TAKE 1 TABLET BY MOUTH  TWICE DAILY  . Fish Oil 1,200 mg, Oral, Daily  . hydrochlorothiazide (HYDRODIURIL) 25 mg, Oral, Daily, Take 25 mg by mouth once daily for 2 days then stop for 1 day, then repeat  . Melatonin 3 mg, Oral, Daily at bedtime  . metoprolol tartrate (LOPRESSOR) 12.5 mg, Oral, 2 times daily  . Multiple Vitamin (MULTIVITAMIN) tablet 1 tablet, Oral, Daily  . Multiple Vitamins-Minerals (EQ VISION FORMULA 50+ PO) 1 capsule, Oral, Daily  . PACERONE 200 MG tablet Take 1/2 (one-half) tablet by mouth once daily  . sodium chloride (OCEAN) 0.65 % SOLN nasal spray 1 spray, Each Nare, As needed  . vitamin C (ASCORBIC ACID) 500 mg, Oral, Daily    Radiology:   No results found.  Cardiac Studies:   Echo- 08/08/2017 1. Left ventricle cavity is normal in size. Mild concentric hypertrophy of the left ventricle. Normal global wall motion. Visual EF is 55-60%. Calculated EF 55%. 2. Mild (Grade I) aortic regurgitation. 3. Trace mitral regurgitation. 4. Trace tricuspid regurgitation. 5. Mildly dilated ascending aorta, measures 3.8 cm.  Carotid artery duplex 09/19/2019:  Duplex suggests stenosis in the right internal carotid artery (50-69%). Stenosis in the right external carotid artery (<50%).  Duplex suggests stenosis in the left internal carotid artery (50-69%). Stenosis in the left external carotid artery (<50%).  Antegrade right vertebral artery flow. Antegrade left vertebral artery flow.  Follow up in six months is appropriate if clinically indicated.  EKG 09/22/2019: Sinus bradycardia at the rate of 54 bpm, first-degree AV block, leftward axis, no evidence of ischemia.   Low-voltage complexes. No significant change from    EKG 03/21/2019   Assessment     ICD-10-CM   1. Paroxysmal atrial fibrillation ([place]). CHA2DS2-VASc Score is 4. Yearly risk of stroke: 4% (A, HTN, Vasc Dz).     I48.0 TSH    EKG 12-Lead  2. Asymptomatic bilateral carotid artery stenosis  I65.23   3. Essential hypertension  I10   4. PVC's (premature ventricular contractions)  I49.3   5. Hypercholesterolemia  E78.00      No orders of the defined types were placed in this encounter.   Medications Discontinued During This Encounter  Medication Reason  . cetirizine (ZYRTEC) 10 MG tablet Error  . ipratropium-albuterol (DUONEB) 0.5-2.5 (3) MG/3ML SOLN Error     Recommendations:   ERVEN RAMSON  is  a 79 y.o. Caucasian male with paroxysmal atrial fibrillation S/P DCCV in 2019, asymptomatic bilateral carotid artery stenosis, hypertension, hyperlipidemia, COPD, no significant coronary disease by angiography on 08/28/2017 presents here for a 26-monthoffice visit.  He also has chronic PVCs and feels occasional palpitations.  He now presents for a 665-monthffice visit.  Patient is presently doing well and except for occasional palpitations.  PVCs no other significant symptoms.  I again reviewed the results of the recently performed carotid artery duplex which reveals bilateral moderate stenosis of 50 to 69%.  He will need continued surveillance of the same.  Blood pressure is well controlled, I reviewed his external labs, lipids are also well controlled.  I do not see a TSH that was done recently, I have placed order for the same.  Otherwise remained stable, tolerating anticoagulation, I will see him back on an annual basis unless carotid artery duplex shows significant abnormality on his next scan in 6 months.  Add: TSH normal  JaAdrian ProwsMD, FAUniversity Medical Center Of El Paso/08/2019, 6:[facility]. PAWaltersffice: [phone]

## 2019-09-22 ENCOUNTER — Encounter: Payer: Self-pay | Admitting: Cardiology

## 2019-09-22 ENCOUNTER — Ambulatory Visit: Payer: Medicare Other | Admitting: Cardiology

## 2019-09-22 ENCOUNTER — Other Ambulatory Visit: Payer: Self-pay

## 2019-09-22 VITALS — BP 130/72 | HR 61 | Temp 98.2°F | Ht 74.0 in | Wt 237.0 lb

## 2019-09-22 DIAGNOSIS — I6523 Occlusion and stenosis of bilateral carotid arteries: Secondary | ICD-10-CM | POA: Diagnosis not present

## 2019-09-22 DIAGNOSIS — E7800 Pure hypercholesterolemia, unspecified: Secondary | ICD-10-CM | POA: Diagnosis not present

## 2019-09-22 DIAGNOSIS — I10 Essential (primary) hypertension: Secondary | ICD-10-CM | POA: Diagnosis not present

## 2019-09-22 DIAGNOSIS — I493 Ventricular premature depolarization: Secondary | ICD-10-CM | POA: Diagnosis not present

## 2019-09-22 DIAGNOSIS — I480 Paroxysmal atrial fibrillation: Secondary | ICD-10-CM

## 2019-09-23 LAB — TSH: TSH: 2.4 u[IU]/mL (ref 0.450–4.500)

## 2019-12-23 DEATH — deceased

## 2020-01-21 ENCOUNTER — Other Ambulatory Visit: Payer: Self-pay | Admitting: Cardiology

## 2020-03-08 ENCOUNTER — Ambulatory Visit: Payer: Medicare Other

## 2020-03-08 ENCOUNTER — Other Ambulatory Visit: Payer: Self-pay

## 2020-03-08 DIAGNOSIS — I6523 Occlusion and stenosis of bilateral carotid arteries: Secondary | ICD-10-CM

## 2020-03-14 ENCOUNTER — Other Ambulatory Visit: Payer: Self-pay | Admitting: Cardiology

## 2020-03-14 DIAGNOSIS — I6523 Occlusion and stenosis of bilateral carotid arteries: Secondary | ICD-10-CM

## 2020-05-31 DIAGNOSIS — E7800 Pure hypercholesterolemia, unspecified: Secondary | ICD-10-CM | POA: Diagnosis not present

## 2020-05-31 DIAGNOSIS — I10 Essential (primary) hypertension: Secondary | ICD-10-CM | POA: Diagnosis not present

## 2020-06-03 DIAGNOSIS — J449 Chronic obstructive pulmonary disease, unspecified: Secondary | ICD-10-CM | POA: Diagnosis not present

## 2020-06-03 DIAGNOSIS — E7800 Pure hypercholesterolemia, unspecified: Secondary | ICD-10-CM | POA: Diagnosis not present

## 2020-06-03 DIAGNOSIS — Z0001 Encounter for general adult medical examination with abnormal findings: Secondary | ICD-10-CM | POA: Diagnosis not present

## 2020-06-03 DIAGNOSIS — I10 Essential (primary) hypertension: Secondary | ICD-10-CM | POA: Diagnosis not present

## 2020-06-03 DIAGNOSIS — M109 Gout, unspecified: Secondary | ICD-10-CM | POA: Diagnosis not present

## 2020-07-21 ENCOUNTER — Other Ambulatory Visit: Payer: Self-pay | Admitting: Cardiology

## 2020-07-21 NOTE — Telephone Encounter (Signed)
Please refill.

## 2020-07-29 ENCOUNTER — Other Ambulatory Visit: Payer: Self-pay | Admitting: Cardiology

## 2020-09-22 ENCOUNTER — Ambulatory Visit: Payer: Medicare Other | Admitting: Cardiology

## 2020-09-22 ENCOUNTER — Other Ambulatory Visit: Payer: Self-pay

## 2020-09-22 ENCOUNTER — Encounter: Payer: Self-pay | Admitting: Cardiology

## 2020-09-22 VITALS — BP 139/57 | HR 56 | Temp 98.0°F | Resp 16 | Ht 74.0 in | Wt 245.0 lb

## 2020-09-22 DIAGNOSIS — I480 Paroxysmal atrial fibrillation: Secondary | ICD-10-CM

## 2020-09-22 DIAGNOSIS — I10 Essential (primary) hypertension: Secondary | ICD-10-CM

## 2020-09-22 DIAGNOSIS — E7800 Pure hypercholesterolemia, unspecified: Secondary | ICD-10-CM

## 2020-09-22 NOTE — Progress Notes (Signed)
Primary Physician/Referring:  Deland Pretty, MD  Patient ID: Robert Hensley, male    DOB: Aug 05, 1940, 80 y.o.   MRN: [id number]  Chief Complaint  Patient presents with  . Paroxysmal atrial fibrillation   . Follow-up    1 year   HPI:    Robert Hensley  is a 80 y.o. Caucasian male with paroxysmal atrial fibrillation S/P DCCV in 2019, asymptomatic bilateral carotid artery stenosis, hypertension, hyperlipidemia, COPD, no significant coronary disease by angiography on 08/28/2017 presents here for a 57-monthoffice visit.  He also has chronic PVCs and feels occasional palpitations.    This annual visit, denies dizziness, syncope, palpitations.  He has had mild cramping in his legs and weakness in his legs right worse than the left but otherwise states that this is not lifestyle limiting and symptoms are very mild.  He continues to volunteer 2 to 3 days at the [employer] also continues to walk for at least 1 mile to 2 miles 3 to 4 days a week.  Past Medical History:  Diagnosis Date  . A-fib ([place]   . Blood transfusion   . Complication of anesthesia   . Diverticulitis   . 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[place]   . GI bleed   . Hyperlipidemia   . Hypertension   . Pneumonia   . Prostate cancer ([facility]    Past Surgical History:  Procedure Laterality Date  . CARDIOVERSION N/A 09/11/2017   Procedure: CARDIOVERSION;  Surgeon: PNigel Mormon MD;  [facility];  Service: Cardiovascular;  Laterality: N/A;  . cyst removed from back    . cyst removed from right knee    . LEFT HEART CATH AND CORONARY ANGIOGRAPHY N/A 08/28/2017   Procedure: LEFT HEART CATH AND CORONARY ANGIOGRAPHY;  Surgeon: PNigel Mormon MD;  [place];  Service: Cardiovascular;  Laterality: N/A;  . PROSTATECTOMY    . TESTICLE REMOVAL     Right  . TUMOR REMOVAL  1976   Between lung and chest wall   Family History  Problem Relation Age of Onset  . Liver cancer Sister   . Cancer Brother   . Heart  failure Maternal Aunt   . Hypertension Maternal Aunt     Social History   Tobacco Use  . Smoking status: Former Smoker    Packs/day: 1.00    Years: 25.00    Pack years: 25.00    Types: Cigarettes    Quit date: 09/22/1974    Years since quitting: 46.0  . Smokeless tobacco: Former [employer] developer   Types: Chew    Quit date: 07/24/1994  Substance Use Topics  . Alcohol use: No   ROS  Review of Systems  Cardiovascular: Positive for claudication (calf, right worse) and palpitations. Negative for chest pain, dyspnea on exertion and leg swelling.  Gastrointestinal: Negative for melena.   Objective  Blood pressure (!) 139/57, pulse (!) 56, temperature 98 F (36.7 C), temperature source Temporal, resp. rate 16, height '6\' 2"'  (1.88 m), weight 245 lb (111.1 kg), SpO2 99 %.  Vitals with BMI 09/22/2020 09/22/2019 09/22/2019  Height '6\' 2"'  - '6\' 2"'   Weight 245 lbs - 237 lbs  BMI 326.37- 385.88 Systolic [id number] Diastolic 57 72 73  Pulse 56 61 59     Physical Exam Constitutional:      Appearance: He is obese.  Cardiovascular:     Rate and Rhythm: Normal rate and regular rhythm.  Pulses:          Carotid pulses are 2+ on the right side and 2+ on the left side.      Femoral pulses are 2+ on the right side and 2+ on the left side.      Popliteal pulses are 1+ on the right side and 1+ on the left side.       Dorsalis pedis pulses are 0 on the right side and 0 on the left side.       Posterior tibial pulses are 1+ on the right side and 1+ on the left side.     Heart sounds: Normal heart sounds. No murmur heard. No gallop.      Comments: No leg edema, no JVD. Pulmonary:     Effort: Pulmonary effort is normal.     Breath sounds: Normal breath sounds.  Abdominal:     General: Bowel sounds are normal.     Palpations: Abdomen is soft.  Musculoskeletal:        General: No swelling.  Skin:    General: Skin is warm and dry.    Laboratory examination:    Ref Range & Units 1 d ago  TSH 0.450 - 4.500  uIU/mL 2.400     External labs:     Cholesterol, total 133.000 m 05/31/2020 HDL 48.000 mg 05/31/2020 LDL 65.000 mg 05/31/2020 Triglycerides 109.000 m 05/31/2020  Creatinine, Serum 0.750 MG/ 06/02/2020 Potassium 4.700 MM 05/16/2016 ALT (SGPT) 21.000 IU/ 05/31/2020  TSH 2.400 09/22/2019  Serum glucose 93 mg, BUN 11, creatinine 0.86, EGFR >80 mL.  Potassium 4.8.  CMP normal.  Total cholesterol 120, triglycerides 84, HDL 44, LDL 59.  Medications and allergies   Allergies  Allergen Reactions  . Penicillins Anaphylaxis and Other (See Comments)    "throat swelling with synthetic penicillins" Has patient had a PCN reaction causing immediate rash, facial/tongue/throat swelling, SOB or lightheadedness with hypotension: No Has patient had a PCN reaction causing severe rash involving mucus membranes or skin necrosis: No Has patient had a PCN reaction that required hospitalization: Yes Has patient had a PCN reaction occurring within the last 10 years: No If all of the above answers are "NO", then may proceed with Cephalosporin use.   . Other Other (See Comments)    General Anesthesia - BP drops     Current Outpatient Medications on File Prior to Visit  Medication Sig Dispense Refill  . acetaminophen (TYLENOL) 500 MG tablet Take 250-500 mg by mouth daily as needed for moderate pain or headache.    Marland Kitchen amiodarone (PACERONE) 200 MG tablet Take 1/2 (one-half) tablet by mouth once daily 90 tablet 0  . amLODipine (NORVASC) 10 MG tablet Take 10 mg by mouth daily.    Marland Kitchen atorvastatin (LIPITOR) 40 MG tablet Take 20 mg by mouth daily.    . B Complex Vitamins (B COMPLEX-B12 PO) Take 1 tablet by mouth daily.    . benazepril (LOTENSIN) 40 MG tablet Take 40 mg by mouth daily.    Marland Kitchen ELIQUIS 5 MG TABS tablet TAKE 1 TABLET BY MOUTH  TWICE DAILY 180 tablet 3  . hydrochlorothiazide (HYDRODIURIL) 25 MG tablet Take 25 mg by mouth daily.    . Melatonin 10 MG CAPS Take 10 mg by mouth at bedtime.    . metoprolol  tartrate (LOPRESSOR) 25 MG tablet Take 12.5 mg by mouth 2 (two) times daily.     . Multiple Vitamin (MULTIVITAMIN) tablet Take 1 tablet by mouth daily.    Marland Kitchen  Multiple Vitamins-Minerals (EQ VISION FORMULA 50+ PO) Take 1 capsule by mouth daily.    . Omega-3 Fatty Acids (FISH OIL) 1200 MG CAPS Take 1,200 mg by mouth daily.     . sodium chloride (OCEAN) 0.65 % SOLN nasal spray Place 1 spray into both nostrils as needed for congestion.    . vitamin C (ASCORBIC ACID) 500 MG tablet Take 500 mg by mouth daily.      No current facility-administered medications on file prior to visit.     Radiology:   No results found.  Cardiac Studies:   Echo- 08/08/2017 1. Left ventricle cavity is normal in size. Mild concentric hypertrophy of the left ventricle. Normal global wall motion. Visual EF is 55-60%. Calculated EF 55%. 2. Mild (Grade I) aortic regurgitation. 3. Trace mitral regurgitation. 4. Trace tricuspid regurgitation. 5. Mildly dilated ascending aorta, measures 3.8 cm.  Carotid artery duplex 09/19/2019:  Duplex suggests stenosis in the right internal carotid artery (50-69%). Stenosis in the right external carotid artery (<50%).  Duplex suggests stenosis in the left internal carotid artery (50-69%). Stenosis in the left external carotid artery (<50%).  Antegrade right vertebral artery flow. Antegrade left vertebral artery flow.  Follow up in six months is appropriate if clinically indicated.  EKG:   EKG 09/22/2020: Sinus rhythm with first-degree block at rate of 54 bpm, left axis deviation, left anterior fascicular block.  Poor R progression, cannot exclude anteroseptal infarct old.  Low-voltage complexes.   No significant change from EKG 09/22/2019,  EKG 03/21/2019   Assessment     ICD-10-CM   1. Paroxysmal atrial fibrillation (HCC)  I48.0 EKG 12-Lead  2. Essential hypertension  I10   3. Hypercholesterolemia  E78.00      No orders of the defined types were placed in this encounter.    There are no discontinued medications.   Recommendations:   Robert Hensley  is a 80 y.o. Caucasian male with paroxysmal atrial fibrillation S/P DCCV in 2019, asymptomatic bilateral carotid artery stenosis, hypertension, hyperlipidemia, COPD, no significant coronary disease by angiography on 08/28/2017 presents here for a 70-monthoffice visit.  He also has chronic PVCs and feels occasional palpitations.  He now presents for a 1 year office visit.  He is presently doing well, he is maintaining sinus rhythm with first-degree AV block, no dizziness, no presyncope.  He is tolerating anticoagulation with Eliquis, being monitored closely by his PCP, renal function has remained stable.  He does have mild symptoms of claudication involving right calf worse than the left, however states that this has been nonlife style limiting.  I did not order any tests.  He does have decreased pulses.  Lipids are well controlled.  He has no specific complaints today, no clinical evidence of heart failure, no change in EKG from prior EKG, I will see him back in a year.  I reviewed his external labs.   JAdrian Prows MD, [facility]3/08/2020, 10:40 AM Office: [phone]: [phone]

## 2021-01-17 ENCOUNTER — Other Ambulatory Visit: Payer: Self-pay | Admitting: Cardiology

## 2021-01-17 NOTE — Telephone Encounter (Signed)
Pt needs refill

## 2021-02-10 ENCOUNTER — Emergency Department (HOSPITAL_COMMUNITY)
Admission: EM | Admit: 2021-02-10 | Discharge: 2021-02-11 | Disposition: A | Discharge status: Home / Self Care | Payer: Medicare Other | Attending: Emergency Medicine | Admitting: Emergency Medicine

## 2021-02-10 ENCOUNTER — Other Ambulatory Visit: Payer: Self-pay

## 2021-02-10 ENCOUNTER — Encounter (HOSPITAL_COMMUNITY): Payer: Self-pay | Admitting: *Deleted

## 2021-02-10 ENCOUNTER — Emergency Department (HOSPITAL_COMMUNITY): Payer: Medicare Other

## 2021-02-10 DIAGNOSIS — Z87891 Personal history of nicotine dependence: Secondary | ICD-10-CM | POA: Diagnosis not present

## 2021-02-10 DIAGNOSIS — R059 Cough, unspecified: Secondary | ICD-10-CM | POA: Diagnosis not present

## 2021-02-10 DIAGNOSIS — Z8546 Personal history of malignant neoplasm of prostate: Secondary | ICD-10-CM | POA: Insufficient documentation

## 2021-02-10 DIAGNOSIS — Z20822 Contact with and (suspected) exposure to covid-19: Secondary | ICD-10-CM | POA: Diagnosis not present

## 2021-02-10 DIAGNOSIS — J449 Chronic obstructive pulmonary disease, unspecified: Secondary | ICD-10-CM | POA: Diagnosis not present

## 2021-02-10 DIAGNOSIS — I10 Essential (primary) hypertension: Secondary | ICD-10-CM | POA: Diagnosis not present

## 2021-02-10 DIAGNOSIS — R0602 Shortness of breath: Secondary | ICD-10-CM

## 2021-02-10 DIAGNOSIS — I499 Cardiac arrhythmia, unspecified: Secondary | ICD-10-CM | POA: Diagnosis not present

## 2021-02-10 DIAGNOSIS — R079 Chest pain, unspecified: Secondary | ICD-10-CM | POA: Diagnosis not present

## 2021-02-10 DIAGNOSIS — Z955 Presence of coronary angioplasty implant and graft: Secondary | ICD-10-CM | POA: Insufficient documentation

## 2021-02-10 DIAGNOSIS — R0789 Other chest pain: Secondary | ICD-10-CM | POA: Insufficient documentation

## 2021-02-10 DIAGNOSIS — Z743 Need for continuous supervision: Secondary | ICD-10-CM | POA: Diagnosis not present

## 2021-02-10 DIAGNOSIS — R6889 Other general symptoms and signs: Secondary | ICD-10-CM | POA: Diagnosis not present

## 2021-02-10 LAB — CBC WITH DIFFERENTIAL/PLATELET
Abs Immature Granulocytes: 0.05 10*3/uL (ref 0.00–0.07)
Basophils Absolute: 0.1 10*3/uL (ref 0.0–0.1)
Basophils Relative: 0 %
Eosinophils Absolute: 0 10*3/uL (ref 0.0–0.5)
Eosinophils Relative: 0 %
HCT: 40.5 % (ref 39.0–52.0)
Hemoglobin: 14 g/dL (ref 13.0–17.0)
Immature Granulocytes: 0 %
Lymphocytes Relative: 4 %
Lymphs Abs: 0.7 10*3/uL (ref 0.7–4.0)
MCH: 31 pg (ref 26.0–34.0)
MCHC: 34.6 g/dL (ref 30.0–36.0)
MCV: 89.6 fL (ref 80.0–100.0)
Monocytes Absolute: 0.9 10*3/uL (ref 0.1–1.0)
Monocytes Relative: 5 %
Neutro Abs: 15.3 10*3/uL — ABNORMAL HIGH (ref 1.7–7.7)
Neutrophils Relative %: 91 %
Platelets: 233 10*3/uL (ref 150–400)
RBC: 4.52 MIL/uL (ref 4.22–5.81)
RDW: 13.1 % (ref 11.5–15.5)
WBC: 17 10*3/uL — ABNORMAL HIGH (ref 4.0–10.5)
nRBC: 0 % (ref 0.0–0.2)

## 2021-02-10 LAB — BASIC METABOLIC PANEL
Anion gap: 9 (ref 5–15)
BUN: 16 mg/dL (ref 8–23)
CO2: 27 mmol/L (ref 22–32)
Calcium: 9.5 mg/dL (ref 8.9–10.3)
Chloride: 95 mmol/L — ABNORMAL LOW (ref 98–111)
Creatinine, Ser: 0.71 mg/dL (ref 0.61–1.24)
GFR, Estimated: >60 mL/min (ref >60)
Glucose, Bld: 107 mg/dL — ABNORMAL HIGH (ref 70–99)
Potassium: 3.8 mmol/L (ref 3.5–5.1)
Sodium: 131 mmol/L — ABNORMAL LOW (ref 135–145)

## 2021-02-10 LAB — TROPONIN I (HIGH SENSITIVITY)
Troponin I (High Sensitivity): 5 ng/L (ref <18)
Troponin I (High Sensitivity): 7 ng/L (ref <18)

## 2021-02-10 NOTE — ED Triage Notes (Signed)
Pt arrived by [employer] from work. Reports chest tightness and sob. Received ASA and 1 nitro pta, became hypotensive, given 257ml NS bolus and bp is now 139/69. Has cardiac hx, sob relieved pta.

## 2021-02-10 NOTE — ED Provider Notes (Signed)
Emergency Medicine Provider Triage Evaluation Note  Robert Hensley , a 80 y.o. male  was evaluated in triage.  Pt complains of chest tightness that started today around noon, states he felt slightly short of breath nonradiating chest pain.  Took his pulse oximeter read 90 Called his provider to go to the emergency department.  Patient was given 2 nitros nitros dropped his blood pressure he had to receive fluids.  He states he has no complaints this time.  No chest pain, shortness of breath, no orthopnea or leg swelling.  Has history of A. fib. On anticoagulant..  Review of Systems  Positive: Chest pain, shortness of breath Negative: Orthopnea, pedal edema  Physical Exam  BP 139/69   Pulse (!) 105   Temp 98.7 F (37.1 C) (Oral)   Resp 16   SpO2 96%  Gen:   Awake, no distress   Resp:  Normal effort  MSK:   Moves extremities without difficulty  Other:    Medical Decision Making  Medically screening exam initiated at 2:35 PM.  Appropriate orders placed.  Robert Hensley was informed that the remainder of the evaluation will be completed by another provider, this initial triage assessment does not replace that evaluation, and the importance of remaining in the ED until their evaluation is complete.  Presents with chest pain, shortness of breath, lab work and imaging been ordered, patient need further work-up.   Marcello Fennel, PA-C 02/10/21 1436    Tegeler, Gwenyth Allegra, MD 02/10/21 [phone]

## 2021-02-11 LAB — RESP PANEL BY RT-PCR (FLU A&B, COVID) ARPGX2
Influenza A by PCR: NEGATIVE
Influenza B by PCR: NEGATIVE
SARS Coronavirus 2 by RT PCR: NEGATIVE

## 2021-02-11 NOTE — Discharge Instructions (Addendum)
You were seen in the ED today with trouble breathing and cough. I am not seeing any sign of pneumonia. Please use your albuterol as needed. Return with any new or worsening symptoms such as chest pain, return of trouble breathing, fever, or other sudden/severe symptoms.

## 2021-02-11 NOTE — ED Provider Notes (Signed)
Emergency Department Provider Note   I have reviewed the triage vital signs and the nursing notes.   HISTORY  Chief Complaint Chest Pain and Shortness of Breath   HPI Robert Hensley is a 80 y.o. male with past medical history reviewed below including COPD presents to the emergency department with shortness of breath with some mild productive cough.  Patient states symptoms been progressing over the past 1 to 2 days.  He did have some chest tightness yesterday but that is improved.  Denies any pleuritic pain.  He states that he was noticing that his oxygen was running in the low 90s which he states is unusual for him.  He felt like he was having some productive cough and contacted his PCP who advised he present to the emergency department.  He drove himself to the fire department who gave him nitroglycerin which transiently dropped his blood pressures but then improved after fluids.  He states he since been having some productive coughing and is overall feeling well.  He has not had chest tightness since yesterday. Has albuterol at home but did not think to try that PTA.   Past Medical History:  Diagnosis Date   A-fib ([place])    Blood transfusion    Complication of anesthesia    Diverticulitis    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[place])    GI bleed    Hyperlipidemia    Hypertension    Pneumonia    Prostate cancer [facility])     Patient Active Problem List   Diagnosis Date Noted   Abnormal stress test 08/26/2017   Right lower quadrant pain 11/01/2011   HTN (hypertension) 11/01/2011   Hypercholesterolemia 11/01/2011   COPD (chronic obstructive pulmonary disease) ([place]) 11/01/2011    Past Surgical History:  Procedure Laterality Date   CARDIOVERSION N/A 09/11/2017   Procedure: CARDIOVERSION;  Surgeon: Nigel Mormon, MD;  Location: [place];  Service: Cardiovascular;  Laterality: N/A;   cyst removed from back     cyst removed from right knee     LEFT HEART CATH AND CORONARY ANGIOGRAPHY N/A  08/28/2017   Procedure: LEFT HEART CATH AND CORONARY ANGIOGRAPHY;  Surgeon: Nigel Mormon, MD;  Location: [place] CV LAB;  Service: Cardiovascular;  Laterality: N/A;   PROSTATECTOMY     TESTICLE REMOVAL     Right   TUMOR REMOVAL  1976   Between lung and chest wall    Allergies Penicillins and Other  Family History  Problem Relation Age of Onset   Liver cancer Sister    Cancer Brother    Heart failure Maternal Aunt    Hypertension Maternal Aunt     Social History Social History   Tobacco Use   Smoking status: Former    Packs/day: 1.00    Years: 25.00    Pack years: 25.00    Types: Cigarettes    Quit date: 09/22/1974    Years since quitting: 46.4   Smokeless tobacco: Former    Types: Chew    Quit date: 07/24/1994  Substance Use Topics   Alcohol use: No   Drug use: No    Review of Systems  Constitutional: No fever/chills Eyes: No visual changes. ENT: No sore throat. Cardiovascular: Positive chest pain. Respiratory: Positive shortness of breath and cough.  Gastrointestinal: No abdominal pain.  No nausea, no vomiting.  No diarrhea.  No constipation. Genitourinary: Negative for dysuria. Musculoskeletal: Negative for back pain. Skin: Negative for rash. Neurological: Negative for headaches, focal weakness or numbness.  10-point ROS otherwise negative.  ____________________________________________   PHYSICAL EXAM:  VITAL SIGNS: ED Triage Vitals  Enc Vitals Group     BP 02/10/21 1405 139/69     Pulse Rate 02/10/21 1405 (!) 105     Resp 02/10/21 1405 16     Temp 02/10/21 1405 98.7 F (37.1 C)     Temp Source 02/10/21 1405 Oral     SpO2 02/10/21 1405 97 %   Constitutional: Alert and oriented. Well appearing and in no acute distress. Eyes: Conjunctivae are normal.  Head: Atraumatic. Nose: No congestion/rhinnorhea. Mouth/Throat: Mucous membranes are moist.  Neck: No stridor.   Cardiovascular: Normal rhythm. Good peripheral circulation. Grossly normal  heart sounds.   Respiratory: Normal respiratory effort.  No retractions. Lungs CTAB. No wheezing.  Gastrointestinal: Soft and nontender. No distention.  Musculoskeletal: No lower extremity tenderness nor edema. No gross deformities of extremities. Neurologic:  Normal speech and language. No gross focal neurologic deficits are appreciated.  Skin:  Skin is warm, dry and intact. No rash noted.  ____________________________________________   LABS (all labs ordered are listed, but only abnormal results are displayed)  Labs Reviewed  BASIC METABOLIC PANEL - Abnormal; Notable for the following components:      Result Value   Sodium 131 (*)    Chloride 95 (*)    Glucose, Bld 107 (*)    All other components within normal limits  CBC WITH DIFFERENTIAL/PLATELET - Abnormal; Notable for the following components:   WBC 17.0 (*)    Neutro Abs 15.3 (*)    All other components within normal limits  RESP PANEL BY RT-PCR (FLU A&B, COVID) ARPGX2  TROPONIN I (HIGH SENSITIVITY)  TROPONIN I (HIGH SENSITIVITY)   ____________________________________________  EKG   EKG Interpretation  Date/Time:  Thursday February 10 2021 14:05:34 EDT Ventricular Rate:  91 PR Interval:  226 QRS Duration: 92 QT Interval:  358 QTC Calculation: 440 R Axis:   -38 Text Interpretation: Sinus rhythm with 1st degree A-V block Left axis deviation Inferior infarct , age undetermined Possible Anterior infarct , age undetermined Abnormal ECG Confirmed by Nanda Quinton [phone]) on 02/11/2021 2:17:30 AM        ____________________________________________  RADIOLOGY  CXR reviewed.   ____________________________________________   PROCEDURES  Procedure(s) performed:   Procedures  None  ____________________________________________   INITIAL IMPRESSION / ASSESSMENT AND PLAN / ED COURSE  Pertinent labs & imaging results that were available during my care of the patient were reviewed by me and considered in my medical  decision making (see chart for details).   Patient presents to the emergency department with mainly shortness of breath and cough symptoms.  His COVID and flu test is negative.  He had some tightness in his chest yesterday but denies any active pain.  No pleuritic pain.  My suspicion for ACS or PE is very low.  He became transiently hypotensive with nitroglycerin given prior to arrival but no hypotensive episodes here.  He does not appear septic.  He states his symptoms have completely resolved at this time.  X-ray does not show any community-acquired pneumonia.  His troponins are negative x2.  He had a left heart cath in 2019 with mild, nonobstructive coronary disease. Patient to continue supportive care at home and albuterol PRN. Will follow with PCP in the coming week. Discussed ED return precautions.    ____________________________________________  FINAL CLINICAL IMPRESSION(S) / ED DIAGNOSES  Final diagnoses:  Shortness of breath     Note:  This document  was prepared using Systems analyst and may include unintentional dictation errors.  Nanda Quinton, MD, [facility] Emergency Medicine    Jinnie Onley, Wonda [facility], MD 02/11/21 [phone]

## 2021-02-14 ENCOUNTER — Emergency Department (HOSPITAL_BASED_OUTPATIENT_CLINIC_OR_DEPARTMENT_OTHER): Payer: Medicare Other

## 2021-02-14 ENCOUNTER — Encounter (HOSPITAL_BASED_OUTPATIENT_CLINIC_OR_DEPARTMENT_OTHER): Payer: Self-pay | Admitting: *Deleted

## 2021-02-14 ENCOUNTER — Emergency Department (HOSPITAL_BASED_OUTPATIENT_CLINIC_OR_DEPARTMENT_OTHER)
Admission: EM | Admit: 2021-02-14 | Discharge: 2021-02-14 | Disposition: A | Discharge status: Home / Self Care | Payer: Medicare Other | Attending: Emergency Medicine | Admitting: Emergency Medicine

## 2021-02-14 ENCOUNTER — Other Ambulatory Visit: Payer: Self-pay

## 2021-02-14 DIAGNOSIS — J441 Chronic obstructive pulmonary disease with (acute) exacerbation: Secondary | ICD-10-CM | POA: Diagnosis not present

## 2021-02-14 DIAGNOSIS — Z79899 Other long term (current) drug therapy: Secondary | ICD-10-CM | POA: Insufficient documentation

## 2021-02-14 DIAGNOSIS — U071 COVID-19: Secondary | ICD-10-CM | POA: Diagnosis not present

## 2021-02-14 DIAGNOSIS — Z8546 Personal history of malignant neoplasm of prostate: Secondary | ICD-10-CM | POA: Insufficient documentation

## 2021-02-14 DIAGNOSIS — I10 Essential (primary) hypertension: Secondary | ICD-10-CM | POA: Insufficient documentation

## 2021-02-14 DIAGNOSIS — J449 Chronic obstructive pulmonary disease, unspecified: Secondary | ICD-10-CM

## 2021-02-14 DIAGNOSIS — Z87891 Personal history of nicotine dependence: Secondary | ICD-10-CM | POA: Diagnosis not present

## 2021-02-14 DIAGNOSIS — R0602 Shortness of breath: Secondary | ICD-10-CM | POA: Diagnosis not present

## 2021-02-14 LAB — TROPONIN I (HIGH SENSITIVITY)
Troponin I (High Sensitivity): 8 ng/L (ref <18)
Troponin I (High Sensitivity): 8 ng/L

## 2021-02-14 LAB — CBC WITH DIFFERENTIAL/PLATELET
Abs Immature Granulocytes: 0.03 10*3/uL (ref 0.00–0.07)
Basophils Absolute: 0 10*3/uL (ref 0.0–0.1)
Basophils Relative: 1 %
Eosinophils Absolute: 0.1 10*3/uL (ref 0.0–0.5)
Eosinophils Relative: 1 %
HCT: 39.6 % (ref 39.0–52.0)
Hemoglobin: 13.6 g/dL (ref 13.0–17.0)
Immature Granulocytes: 0 %
Lymphocytes Relative: 14 %
Lymphs Abs: 1.1 10*3/uL (ref 0.7–4.0)
MCH: 30.4 pg (ref 26.0–34.0)
MCHC: 34.3 g/dL (ref 30.0–36.0)
MCV: 88.6 fL (ref 80.0–100.0)
Monocytes Absolute: 1.2 10*3/uL — ABNORMAL HIGH (ref 0.1–1.0)
Monocytes Relative: 17 %
Neutro Abs: 5 10*3/uL (ref 1.7–7.7)
Neutrophils Relative %: 67 %
Platelets: 220 10*3/uL (ref 150–400)
RBC: 4.47 MIL/uL (ref 4.22–5.81)
RDW: 13.8 % (ref 11.5–15.5)
WBC: 7.4 10*3/uL (ref 4.0–10.5)
nRBC: 0 % (ref 0.0–0.2)

## 2021-02-14 LAB — BASIC METABOLIC PANEL WITH GFR
Anion gap: 10 (ref 5–15)
BUN: 9 mg/dL (ref 8–23)
CO2: 27 mmol/L (ref 22–32)
Calcium: 8.7 mg/dL — ABNORMAL LOW (ref 8.9–10.3)
Chloride: 94 mmol/L — ABNORMAL LOW (ref 98–111)
Creatinine, Ser: 0.61 mg/dL (ref 0.61–1.24)
GFR, Estimated: >60 mL/min
Glucose, Bld: 97 mg/dL (ref 70–99)
Potassium: 3.9 mmol/L (ref 3.5–5.1)
Sodium: 131 mmol/L — ABNORMAL LOW (ref 135–145)

## 2021-02-14 LAB — RESP PANEL BY RT-PCR (FLU A&B, COVID) ARPGX2
Influenza A by PCR: NEGATIVE
Influenza B by PCR: NEGATIVE
SARS Coronavirus 2 by RT PCR: POSITIVE — AB

## 2021-02-14 MED ORDER — PREDNISONE 10 MG (21) PO TBPK: ORAL_TABLET | ORAL | 0 refills | Status: DC

## 2021-02-14 NOTE — ED Provider Notes (Addendum)
[facility] Provider Note  CSN: [id number] Arrival date & time: 02/14/21 1517    History Chief Complaint  Patient presents with   Shortness of Breath   Covid postitve    Robert Hensley is a 80 y.o. male with history of COPD usually well controlled with his home inhaler/nebulizers, does not wear home oxygen. He reports onset of increasing SOB, wheezing, and myalgias the morning of 7/21. He went to the ED later that afternoon and was there until the following morning. He had a workup then including a Covid PCR and was ultimately discharged home. He has continued to have symptoms over the weekend and so he took a home Covid test this morning that was positive. He was advised by his PCP to come to the ED for evaluation. Denies fever or chest pain. Has not been hypoxic at home but states he has been getting winded with walking in his house.   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[place])    GI bleed    Hyperlipidemia    Hypertension    Pneumonia    Prostate cancer [facility])     Past Surgical History:  Procedure Laterality Date   CARDIOVERSION N/A 09/11/2017   Procedure: CARDIOVERSION;  Surgeon: Nigel Mormon, MD;  Location: [facility] ENDOSCOPY;  Service: Cardiovascular;  Laterality: N/A;   cyst removed from back     cyst removed from right knee     LEFT HEART CATH AND CORONARY ANGIOGRAPHY N/A 08/28/2017   Procedure: LEFT HEART CATH AND CORONARY ANGIOGRAPHY;  Surgeon: Nigel Mormon, MD;  Location: [place] CV LAB;  Service: Cardiovascular;  Laterality: N/A;   PROSTATECTOMY     TESTICLE REMOVAL     Right   TUMOR REMOVAL  1976   Between lung and chest wall    Family History  Problem Relation Age of Onset   Liver cancer Sister    Cancer Brother    Heart failure Maternal Aunt    Hypertension Maternal Aunt     Social History   Tobacco  Use   Smoking status: Former    Packs/day: 1.00    Years: 25.00    Pack years: 25.00    Types: Cigarettes    Quit date: 09/22/1974    Years since quitting: 46.4   Smokeless tobacco: Former    Types: Chew    Quit date: 07/24/1994  Substance Use Topics   Alcohol use: No   Drug use: No     Home Medications Prior to Admission medications   Medication Sig Start Date End Date Taking? Authorizing Provider  amiodarone (PACERONE) 200 MG tablet Take 1/2 (one-half) tablet by mouth once daily 01/17/21  Yes Adrian Prows, MD  amLODipine (NORVASC) 10 MG tablet Take 10 mg by mouth daily.   Yes [provider]  atorvastatin (LIPITOR) 40 MG tablet Take 20 mg by mouth daily.   Yes [provider]  B Complex Vitamins (B COMPLEX-B12 PO) Take 1 tablet by mouth daily.   Yes [provider]  ELIQUIS 5 MG TABS tablet TAKE 1 TABLET BY MOUTH  TWICE DAILY 07/30/20  Yes Adrian Prows, MD  hydrochlorothiazide (HYDRODIURIL) 25 MG tablet Take 25 mg by mouth daily.   Yes [provider]  Melatonin 10 MG CAPS Take 10 mg by mouth at bedtime.   Yes [provider]  metoprolol tartrate (LOPRESSOR) 25 MG tablet Take 12.5 mg by mouth 2 (two) times daily.    Yes [provider]  Multiple Vitamin (MULTIVITAMIN) tablet Take 1 tablet by mouth daily.   Yes [provider]  Multiple Vitamins-Minerals (EQ VISION FORMULA 50+ PO) Take 1 capsule by mouth daily.   Yes [provider]  Omega-3 Fatty Acids (FISH OIL) 1200 MG CAPS Take 1,200 mg by mouth daily.    Yes [provider]  predniSONE (STERAPRED UNI-PAK 21 TAB) 10 MG (21) TBPK tablet '10mg'$  Tabs, 6 day taper. Use as directed 02/14/21  Yes Truddie Hidden, MD  sodium chloride (OCEAN) 0.65 % SOLN nasal spray Place 1 spray into both nostrils as needed for congestion.   Yes [provider]  acetaminophen (TYLENOL) 500 MG tablet Take 250-500 mg by mouth daily as needed for moderate pain or headache.     [provider]  benazepril (LOTENSIN) 40 MG tablet Take 40 mg by mouth daily.    [provider]  vitamin C (ASCORBIC ACID) 500 MG tablet Take 500 mg by mouth daily.     [provider]     Allergies    Penicillins and Other   Review of Systems   Review of Systems A comprehensive review of systems was completed and negative except as noted in HPI.    Physical Exam BP (!) 131/58 (BP Location: Right Arm)   Pulse 83   Temp 98.6 F (37 C) (Oral)   Resp (!) 23   Ht '6\' 2"'$  (1.88 m)   Wt 113.4 kg   SpO2 95%   BMI 32.10 kg/m   Physical Exam Vitals and nursing note reviewed.  Constitutional:      Appearance: Normal appearance.  HENT:     Head: Normocephalic and atraumatic.     Nose: Nose normal.     Mouth/Throat:     Mouth: Mucous membranes are moist.  Eyes:     Extraocular Movements: Extraocular movements intact.     Conjunctiva/sclera: Conjunctivae normal.  Cardiovascular:     Rate and Rhythm: Normal rate.  Pulmonary:     Effort: Pulmonary effort is normal.     Breath sounds: Wheezing (faint end-expiratory) present.  Abdominal:     General: Abdomen is flat.     Palpations: Abdomen is soft.     Tenderness: There is no abdominal tenderness.  Musculoskeletal:        General: No swelling. Normal range of motion.     Cervical back: Neck supple.  Skin:    General: Skin is warm and dry.  Neurological:     General: No focal deficit present.     Mental Status: He is alert.  Psychiatric:        Mood and Affect: Mood normal.     ED Results / Procedures / Treatments   Labs (all labs ordered are listed, but only abnormal results are displayed) Labs Reviewed  RESP PANEL BY RT-PCR (FLU A&B, COVID) ARPGX2 - Abnormal; Notable for the following components:      Result Value   SARS Coronavirus 2 by RT PCR POSITIVE (*)    All other components within normal limits  BASIC METABOLIC PANEL - Abnormal; Notable for the following components:   Sodium 131  (*)    Chloride 94 (*)    Calcium 8.7 (*)    All other components within normal limits  CBC WITH DIFFERENTIAL/PLATELET - Abnormal; Notable for the following components:  Monocytes Absolute 1.2 (*)    All other components within normal limits  TROPONIN I (HIGH SENSITIVITY)  TROPONIN I (HIGH SENSITIVITY)    EKG EKG Interpretation  Date/Time:  Monday February 14 2021 19:09:18 EDT Ventricular Rate:  74 PR Interval:  220 QRS Duration: 99 QT Interval:  378 QTC Calculation: 420 R Axis:   -2 Text Interpretation: Sinus rhythm Paired ventricular premature complexes Prolonged PR interval Low voltage, precordial leads Since last tracing Premature ventricular complexes NOW PRESENT Confirmed by Calvert Cantor [phone]) on 02/14/2021 7:25:01 PM   Radiology DG Chest Port 1 View  Result Date: 02/14/2021 CLINICAL DATA:  SOB ,Covid + EXAM: PORTABLE CHEST 1 VIEW COMPARISON:  02/10/2021, chest CT 09/18/2011 FINDINGS: Unchanged, enlarged cardiac silhouette. Aortic calcifications. Unchanged bilateral scarring in volume loss. There is bilateral pleural thickening, left greater than right with calcified pleural plaques. Increased size of a nodular opacity with adjacent reticulation in the peripheral left upper lung. Increased left basilar pleural thickening or pleural effusion. No visible pneumothorax. No acute osseous abnormality. IMPRESSION: Chronic pleural thickening and calcified pleural plaques, left greater than right, increased in the left lung base. Increased size of a nodular opacity with adjacent reticulation in the peripheral left upper lung. Recommend noncontrast CT for further evaluation. Electronically Signed   By: Maurine Simmering   On: 02/14/2021 19:46    Procedures Procedures  Medications Ordered in the ED Medications - No data to display   MDM Rules/Calculators/A&P MDM Patient's Covid is confirmed positive. He is not hypoxic with rest. Will recheck labs to compare to recent visit. May qualify  for Paxlovid if he does not have any medication interactions.   ED Course  I have reviewed the triage vital signs and the nursing notes.  Pertinent labs & imaging results that were available during my care of the patient were reviewed by me and considered in my medical decision making (see chart for details).  Clinical Course as of 02/14/21 2248  Mon Feb 14, 2021  1942 CBC shows improved WBC from recent ED visit. He has not been on steroids at home [CS]  2009 BMP and Trop are normal.  [CS]  2100 Patient remains well appearing. No hypoxia with ambulation. Unfortunately, Paxlovid is contraindicated due to his Eliquis use. Will start a course of prednisone for the COPD, advised to continue nebs as needed and follow up with PCP. Patient aware of nodules on CXR in need of follow up.  [CS]    Clinical Course User Index [CS] Truddie Hidden, MD    Final Clinical Impression(s) / ED Diagnoses Final diagnoses:  COVID-19  Chronic obstructive pulmonary disease, unspecified COPD type ([place])    Rx / DC Orders ED Discharge Orders          Ordered    predniSONE (STERAPRED UNI-PAK 21 TAB) 10 MG (21) TBPK tablet        02/14/21 2102               Truddie Hidden, MD 02/14/21 2248    Truddie Hidden, MD 02/14/21 2248

## 2021-02-14 NOTE — ED Notes (Signed)
RECOLLECT COVID SWAB.

## 2021-02-14 NOTE — ED Triage Notes (Signed)
Shortness of breath since last Thursday.  He tested himself for Covid, positive.

## 2021-03-07 DIAGNOSIS — J449 Chronic obstructive pulmonary disease, unspecified: Secondary | ICD-10-CM | POA: Diagnosis not present

## 2021-03-07 DIAGNOSIS — Z09 Encounter for follow-up examination after completed treatment for conditions other than malignant neoplasm: Secondary | ICD-10-CM | POA: Diagnosis not present

## 2021-03-07 DIAGNOSIS — Z8616 Personal history of COVID-19: Secondary | ICD-10-CM | POA: Diagnosis not present

## 2021-03-09 ENCOUNTER — Other Ambulatory Visit: Payer: Self-pay

## 2021-03-09 ENCOUNTER — Ambulatory Visit: Payer: Medicare Other

## 2021-03-09 DIAGNOSIS — I6523 Occlusion and stenosis of bilateral carotid arteries: Secondary | ICD-10-CM | POA: Diagnosis not present

## 2021-03-30 DIAGNOSIS — I83893 Varicose veins of bilateral lower extremities with other complications: Secondary | ICD-10-CM | POA: Diagnosis not present

## 2021-03-30 DIAGNOSIS — R911 Solitary pulmonary nodule: Secondary | ICD-10-CM | POA: Diagnosis not present

## 2021-03-30 DIAGNOSIS — R600 Localized edema: Secondary | ICD-10-CM | POA: Diagnosis not present

## 2021-04-04 ENCOUNTER — Other Ambulatory Visit: Payer: Self-pay | Admitting: Internal Medicine

## 2021-04-04 DIAGNOSIS — R911 Solitary pulmonary nodule: Secondary | ICD-10-CM

## 2021-04-06 DIAGNOSIS — I10 Essential (primary) hypertension: Secondary | ICD-10-CM | POA: Diagnosis not present

## 2021-04-07 DIAGNOSIS — R600 Localized edema: Secondary | ICD-10-CM | POA: Diagnosis not present

## 2021-04-07 DIAGNOSIS — Z23 Encounter for immunization: Secondary | ICD-10-CM | POA: Diagnosis not present

## 2021-04-22 ENCOUNTER — Ambulatory Visit
Admission: RE | Admit: 2021-04-22 | Discharge: 2021-04-22 | Disposition: A | Discharge status: Home / Self Care | Payer: Medicare Other | Source: Ambulatory Visit | Attending: Internal Medicine | Admitting: Internal Medicine

## 2021-04-22 ENCOUNTER — Other Ambulatory Visit: Payer: Self-pay

## 2021-04-22 DIAGNOSIS — R911 Solitary pulmonary nodule: Secondary | ICD-10-CM | POA: Diagnosis not present

## 2021-04-22 DIAGNOSIS — R918 Other nonspecific abnormal finding of lung field: Secondary | ICD-10-CM | POA: Diagnosis not present

## 2021-04-22 DIAGNOSIS — J9811 Atelectasis: Secondary | ICD-10-CM | POA: Diagnosis not present

## 2021-04-22 DIAGNOSIS — I700 Atherosclerosis of aorta: Secondary | ICD-10-CM | POA: Diagnosis not present

## 2021-04-22 DIAGNOSIS — J439 Emphysema, unspecified: Secondary | ICD-10-CM | POA: Diagnosis not present

## 2021-04-22 DIAGNOSIS — I712 Thoracic aortic aneurysm, without rupture: Secondary | ICD-10-CM | POA: Diagnosis not present

## 2021-04-26 DIAGNOSIS — R911 Solitary pulmonary nodule: Secondary | ICD-10-CM | POA: Diagnosis not present

## 2021-04-26 DIAGNOSIS — I7121 Aneurysm of the ascending aorta, without rupture: Secondary | ICD-10-CM | POA: Diagnosis not present

## 2021-05-19 ENCOUNTER — Institutional Professional Consult (permissible substitution): Payer: Medicare Other | Admitting: Physician Assistant

## 2021-05-19 ENCOUNTER — Other Ambulatory Visit: Payer: Self-pay

## 2021-05-19 VITALS — BP 144/67 | HR 61 | Resp 20 | Ht 74.0 in | Wt 250.0 lb

## 2021-05-19 DIAGNOSIS — I4891 Unspecified atrial fibrillation: Secondary | ICD-10-CM | POA: Insufficient documentation

## 2021-05-19 DIAGNOSIS — I499 Cardiac arrhythmia, unspecified: Secondary | ICD-10-CM | POA: Insufficient documentation

## 2021-05-19 DIAGNOSIS — I77810 Thoracic aortic ectasia: Secondary | ICD-10-CM | POA: Insufficient documentation

## 2021-05-19 DIAGNOSIS — Z87891 Personal history of nicotine dependence: Secondary | ICD-10-CM | POA: Insufficient documentation

## 2021-05-19 DIAGNOSIS — I7121 Aneurysm of the ascending aorta, without rupture: Secondary | ICD-10-CM

## 2021-05-19 DIAGNOSIS — K648 Other hemorrhoids: Secondary | ICD-10-CM | POA: Insufficient documentation

## 2021-05-19 DIAGNOSIS — I83893 Varicose veins of bilateral lower extremities with other complications: Secondary | ICD-10-CM | POA: Insufficient documentation

## 2021-05-19 DIAGNOSIS — L409 Psoriasis, unspecified: Secondary | ICD-10-CM | POA: Insufficient documentation

## 2021-05-19 DIAGNOSIS — I4729 Other ventricular tachycardia: Secondary | ICD-10-CM | POA: Insufficient documentation

## 2021-05-19 DIAGNOSIS — I493 Ventricular premature depolarization: Secondary | ICD-10-CM | POA: Insufficient documentation

## 2021-05-19 DIAGNOSIS — Z85820 Personal history of malignant melanoma of skin: Secondary | ICD-10-CM | POA: Insufficient documentation

## 2021-05-19 DIAGNOSIS — Z6831 Body mass index (BMI) 31.0-31.9, adult: Secondary | ICD-10-CM | POA: Insufficient documentation

## 2021-05-19 DIAGNOSIS — Z8546 Personal history of malignant neoplasm of prostate: Secondary | ICD-10-CM | POA: Insufficient documentation

## 2021-05-19 DIAGNOSIS — K5730 Diverticulosis of large intestine without perforation or abscess without bleeding: Secondary | ICD-10-CM | POA: Insufficient documentation

## 2021-05-19 DIAGNOSIS — I6523 Occlusion and stenosis of bilateral carotid arteries: Secondary | ICD-10-CM | POA: Insufficient documentation

## 2021-05-19 DIAGNOSIS — R911 Solitary pulmonary nodule: Secondary | ICD-10-CM | POA: Insufficient documentation

## 2021-05-19 DIAGNOSIS — I119 Hypertensive heart disease without heart failure: Secondary | ICD-10-CM | POA: Insufficient documentation

## 2021-05-19 DIAGNOSIS — M539 Dorsopathy, unspecified: Secondary | ICD-10-CM | POA: Insufficient documentation

## 2021-05-19 DIAGNOSIS — Z9079 Acquired absence of other genital organ(s): Secondary | ICD-10-CM | POA: Insufficient documentation

## 2021-05-19 DIAGNOSIS — R000 Tachycardia, unspecified: Secondary | ICD-10-CM | POA: Insufficient documentation

## 2021-05-19 DIAGNOSIS — Z87898 Personal history of other specified conditions: Secondary | ICD-10-CM | POA: Insufficient documentation

## 2021-05-19 DIAGNOSIS — R7303 Prediabetes: Secondary | ICD-10-CM | POA: Insufficient documentation

## 2021-05-19 DIAGNOSIS — K279 Peptic ulcer, site unspecified, unspecified as acute or chronic, without hemorrhage or perforation: Secondary | ICD-10-CM | POA: Insufficient documentation

## 2021-05-19 DIAGNOSIS — R292 Abnormal reflex: Secondary | ICD-10-CM | POA: Insufficient documentation

## 2021-05-19 DIAGNOSIS — M109 Gout, unspecified: Secondary | ICD-10-CM | POA: Insufficient documentation

## 2021-05-19 DIAGNOSIS — M419 Scoliosis, unspecified: Secondary | ICD-10-CM | POA: Insufficient documentation

## 2021-05-19 DIAGNOSIS — L720 Epidermal cyst: Secondary | ICD-10-CM | POA: Insufficient documentation

## 2021-05-19 DIAGNOSIS — Z880 Allergy status to penicillin: Secondary | ICD-10-CM | POA: Insufficient documentation

## 2021-05-19 NOTE — Progress Notes (Signed)
[address]       [place] [address]             [phone]        Robert Hensley Robert Hensley Medical Record #[id number] Date of Birth: Jun 20, 1941  Referring: Deland Pretty, MD Primary Care: Deland Pretty, MD Primary Cardiologist:None  Chief Complaint:   No chief complaint on file.   History of Present Illness:      Robert Hensley is an 80 year old male patient with past medical history significant for atrial fibrillation, COPD, hypertension, asymptomatic bilateral carotid stenosis hyperlipidemia and a previous GI bleed (1996)  who was referred to our office by cardiology for a ascending aortic aneurysm.  He underwent a CT of the chest to evaluate a left upper lobe nodule and found a 4.5 cm ascending aortic aneurysm.   The patient presents today to establish care with our office.  He has been having some right lower extremity edema which has been improving with compression socks.  He states he had COVID-19 back in July 2022.  He was started on some steroids at that time but was not given any antivirals due to being on Eliquis.  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 Gordy Savers in 1976.  He states the tumor was drained first and then removed using a large incision on his chest.  He follows Dr. Einar Gip for his cardiologist.  He does have a history of atrial fibrillation and had 1 cardioversion in the past.  He also has a history of aortic stenosis and has had a recent echocardiogram however since it was done through Dr. Irven Shelling office I cannot see this study.  He has not had any new shortness of breath or chest pain.  He does get short of breath from time to time due to his COPD.  Current Activity/ Functional Status: Patient is independent with mobility/ambulation, transfers, ADL's, IADL's.   Zubrod Score: At the time of surgery this patient's most appropriate activity status/level should be described  as: [x]     0    Normal activity, no symptoms []     1    Restricted in physical strenuous activity but ambulatory, able to do out light work []     2    Ambulatory and capable of self care, unable to do work activities, up and about                 more than 50%  Of the time                            []     3    Only limited self care, in bed greater than 50% of waking hours []     4    Completely disabled, no self care, confined to bed or chair []     5    Moribund  Past Medical History:  Diagnosis Date   A-fib ([place])    Blood transfusion    Complication of anesthesia    Diverticulitis    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[place])    GI bleed    Hyperlipidemia    Hypertension    Pneumonia    Prostate cancer [facility])     Past Surgical History:  Procedure Laterality Date   CARDIOVERSION N/A 09/11/2017   Procedure: CARDIOVERSION;  Surgeon: Nigel Mormon, MD;  Location: [facility]  ENDOSCOPY;  Service: Cardiovascular;  Laterality: N/A;   cyst removed from back     cyst removed from right knee     LEFT HEART CATH AND CORONARY ANGIOGRAPHY N/A 08/28/2017   Procedure: LEFT HEART CATH AND CORONARY ANGIOGRAPHY;  Surgeon: Nigel Mormon, MD;  Location: [place] CV LAB;  Service: Cardiovascular;  Laterality: N/A;   PROSTATECTOMY     TESTICLE REMOVAL     Right   TUMOR REMOVAL  1976   Between lung and chest wall    Social History   Tobacco Use  Smoking Status Former   Packs/day: 1.00   Years: 25.00   Pack years: 25.00   Types: Cigarettes   Quit date: 09/22/1974   Years since quitting: 46.6  Smokeless Tobacco Former   Types: Chew   Quit date: 07/24/1994    Social History   Substance and Sexual Activity  Alcohol Use No     Allergies  Allergen Reactions   Penicillins Anaphylaxis and Other (See Comments)    "throat swelling with synthetic penicillins" Has patient had a PCN reaction causing immediate rash, facial/tongue/throat swelling, SOB or lightheadedness with hypotension: No Has patient had a  PCN reaction causing severe rash involving mucus membranes or skin necrosis: No Has patient had a PCN reaction that required hospitalization: Yes Has patient had a PCN reaction occurring within the last 10 years: No If all of the above answers are "NO", then may proceed with Cephalosporin use.    Other Other (See Comments)    General Anesthesia - BP drops     Current Outpatient Medications  Medication Sig Dispense Refill   acetaminophen (TYLENOL) 500 MG tablet Take 250-500 mg by mouth daily as needed for moderate pain or headache.     amiodarone (PACERONE) 200 MG tablet Take 1/2 (one-half) tablet by mouth once daily 90 tablet 0   amLODipine (NORVASC) 10 MG tablet Take 10 mg by mouth daily.     atorvastatin (LIPITOR) 40 MG tablet Take 20 mg by mouth daily.     B Complex Vitamins (B COMPLEX-B12 PO) Take 1 tablet by mouth daily.     benazepril (LOTENSIN) 40 MG tablet Take 40 mg by mouth daily.     ELIQUIS 5 MG TABS tablet TAKE 1 TABLET BY MOUTH  TWICE DAILY 180 tablet 3   hydrochlorothiazide (HYDRODIURIL) 25 MG tablet Take 25 mg by mouth daily.     Melatonin 10 MG CAPS Take 10 mg by mouth at bedtime.     metoprolol tartrate (LOPRESSOR) 25 MG tablet Take 12.5 mg by mouth 2 (two) times daily.      Multiple Vitamin (MULTIVITAMIN) tablet Take 1 tablet by mouth daily.     Multiple Vitamins-Minerals (EQ VISION FORMULA 50+ PO) Take 1 capsule by mouth daily.     Omega-3 Fatty Acids (FISH OIL) 1200 MG CAPS Take 1,200 mg by mouth daily.      predniSONE (STERAPRED UNI-PAK 21 TAB) 10 MG (21) TBPK tablet 10mg  Tabs, 6 day taper. Use as directed 1 each 0   sodium chloride (OCEAN) 0.65 % SOLN nasal spray Place 1 spray into both nostrils as needed for congestion.     vitamin C (ASCORBIC ACID) 500 MG tablet Take 500 mg by mouth daily.      No current facility-administered medications for this visit.    (Not in a hospital admission)   Family History  Problem Relation Age of Onset   Liver cancer Sister     Cancer Brother  Heart failure Maternal Aunt    Hypertension Maternal Aunt      Review of Systems:   ROS Pertinent items are noted in HPI.     Physical Exam: There were no vitals taken for this visit.  Vitals:   05/19/21 0941  BP: (!) 144/67  Pulse: 61  Resp: 20  SpO2: 95%      General appearance: alert, cooperative, and no distress Neck: no carotid bruit and no JVD Resp: clear to auscultation bilaterally Cardio: regular rate and rhythm, S1, S2 normal, no murmur, click, rub or gallop GI: soft, non-tender; bowel sounds normal; no masses,  no organomegaly Extremities: 1-2+ pitting edema on the right lower leg, no edema in the left lower leg Neurologic: Grossly normal  Diagnostic Studies & Laboratory data:  Narrative & Impression  CLINICAL DATA:  LEFT upper lobe nodule follow-up; history of benign tumor excision in 1976.   EXAM: CT CHEST WITHOUT CONTRAST   TECHNIQUE: Multidetector CT imaging of the chest was performed following the standard protocol without IV contrast.   COMPARISON:  September 18, 2011, February 14, 2021.   FINDINGS: Cardiovascular: There is aneurysmal dilation of the ascending thoracic aorta to approximately 4.5 cm. There are calcifications of the aortic valve. Three-vessel coronary artery atherosclerotic calcifications. Atherosclerotic calcifications of the aorta. LEFT vertebral artery directly arises from the aorta. Heart is normal in size. No pericardial effusion.   Mediastinum/Nodes: Thyroid is unremarkable. No axillary or mediastinal adenopathy.   Lungs/Pleura: Mild paraseptal emphysema. Densely calcified LEFT greater than RIGHT pleural plaques. Rounded atelectasis of the LEFT lower lobe. Nodule described on recent chest radiograph corresponds to a densely calcified pleural plaque with associated scar. RIGHT lower lobe subpleural pulmonary nodule measures 7 x 3 mm, unchanged since 2013 and consistent with a benign etiology (series 3,  image 129). Two adjacent 4-5 mm RIGHT lower lobe pulmonary nodules are unchanged since 2013 and consistent with a benign etiology (series 3, image 133; 129). No new suspicious findings.   Upper Abdomen: Low-density nodular thickening of the LEFT adrenal gland most consistent with a benign adenoma. No acute abnormality.   Musculoskeletal: Posttraumatic changes of bilateral ribs. No acute osseous abnormality. Osteopenia.   IMPRESSION: 1. No new suspicious pulmonary nodules. Nodule noted on recent chest CT corresponds to a densely calcified pleural plaque with associated scarring. 2. Aneurysm of the ascending thoracic aorta to 4.5 cm with calcifications of the aortic valve. Findings can be seen in the setting of aortic stenosis. Ascending thoracic aortic aneurysm. Recommend semi-annual imaging followup by CTA or MRA and referral to cardiothoracic surgery if not already obtained. This recommendation follows 2010 ACCF/AHA/AATS/ACR/ASA/SCA/SCAI/SIR/STS/SVM Guidelines for the Diagnosis and Management of Patients With Thoracic Aortic Disease. Circulation. 2010; 121: U045-W098. Aortic aneurysm NOS (ICD10-I71.9)   Aortic aneurysm NOS (ICD10-I71.9). Aortic Atherosclerosis (ICD10-I70.0) and Emphysema (ICD10-J43.9).     Electronically Signed   By: Valentino Saxon M.D.   On: 04/24/2021 09:57       Recent Radiology Findings:   No results found.   I have independently reviewed the above radiologic studies and discussed with the patient   Recent Lab Findings: Lab Results  Component Value Date   WBC 7.4 02/14/2021   HGB 13.6 02/14/2021   HCT 39.6 02/14/2021   PLT 220 02/14/2021   GLUCOSE 97 02/14/2021   ALT 43 11/01/2011   AST 31 11/01/2011   NA 131 (L) 02/14/2021   K 3.9 02/14/2021   CL 94 (L) 02/14/2021   CREATININE 0.61 02/14/2021  BUN 9 02/14/2021   CO2 27 02/14/2021   TSH 2.400 09/22/2019   INR 0.94 04/05/2011      Assessment / Plan:      Robert Hensley is an  80 year old male patient who presented today for a surveillance CT scan to look at his pulmonary nodules and ascending aortic aneurysm.  There were no changes in his pulmonary nodules and they are likely benign.  There were no new nodules found on today's scan.  His a sending aortic aneurysm is 4.5 cm.  We will continue to follow this annually.  Hypertension-little high today but well controlled at home with readings of systolic blood pressure 465-681.  He does have a way to monitor his blood pressure at home.  He has not had any chest pain or new shortness of breath.  Atrial fibrillation status post cardioversion-he is in normal sinus rhythm today and anticoagulated on Eliquis.  He does have a remote history of GI bleed back in 1996.  COPD-currently not on any inhaled corticosteroids.  Overall, well controlled.  He was on a round of steroids recently for his COVID-19 infection back in July 2022.  Asymptomatic bilateral carotid stenosis-he has annual ultrasounds for surveillance.  Plan: Follow-up with a CT scan noncontrast in 1 year for surveillance of pulmonary nodules and a sending aortic aneurysm.  I  spent 30 minutes counseling the patient face to face.   Nicholes Rough, PA-C 05/19/2021 8:27 AM

## 2021-06-06 DIAGNOSIS — I10 Essential (primary) hypertension: Secondary | ICD-10-CM | POA: Diagnosis not present

## 2021-06-09 DIAGNOSIS — J929 Pleural plaque without asbestos: Secondary | ICD-10-CM | POA: Diagnosis not present

## 2021-06-09 DIAGNOSIS — D6869 Other thrombophilia: Secondary | ICD-10-CM | POA: Diagnosis not present

## 2021-06-09 DIAGNOSIS — M109 Gout, unspecified: Secondary | ICD-10-CM | POA: Diagnosis not present

## 2021-06-09 DIAGNOSIS — I7121 Aneurysm of the ascending aorta, without rupture: Secondary | ICD-10-CM | POA: Diagnosis not present

## 2021-06-09 DIAGNOSIS — I119 Hypertensive heart disease without heart failure: Secondary | ICD-10-CM | POA: Diagnosis not present

## 2021-06-09 DIAGNOSIS — I4891 Unspecified atrial fibrillation: Secondary | ICD-10-CM | POA: Diagnosis not present

## 2021-06-09 DIAGNOSIS — J449 Chronic obstructive pulmonary disease, unspecified: Secondary | ICD-10-CM | POA: Diagnosis not present

## 2021-06-09 DIAGNOSIS — I83893 Varicose veins of bilateral lower extremities with other complications: Secondary | ICD-10-CM | POA: Diagnosis not present

## 2021-06-09 DIAGNOSIS — I480 Paroxysmal atrial fibrillation: Secondary | ICD-10-CM | POA: Diagnosis not present

## 2021-06-09 DIAGNOSIS — Z0000 Encounter for general adult medical examination without abnormal findings: Secondary | ICD-10-CM | POA: Diagnosis not present

## 2021-06-09 DIAGNOSIS — I10 Essential (primary) hypertension: Secondary | ICD-10-CM | POA: Diagnosis not present

## 2021-06-23 ENCOUNTER — Other Ambulatory Visit: Payer: Self-pay | Admitting: Cardiology

## 2021-07-20 ENCOUNTER — Other Ambulatory Visit: Payer: Self-pay | Admitting: Cardiology

## 2021-07-20 NOTE — Telephone Encounter (Signed)
Can I refill?

## 2021-08-15 DIAGNOSIS — M5441 Lumbago with sciatica, right side: Secondary | ICD-10-CM | POA: Diagnosis not present

## 2021-08-23 DIAGNOSIS — E7800 Pure hypercholesterolemia, unspecified: Secondary | ICD-10-CM | POA: Diagnosis not present

## 2021-08-23 DIAGNOSIS — I10 Essential (primary) hypertension: Secondary | ICD-10-CM | POA: Diagnosis not present

## 2021-08-23 DIAGNOSIS — I119 Hypertensive heart disease without heart failure: Secondary | ICD-10-CM | POA: Diagnosis not present

## 2021-08-23 DIAGNOSIS — I7121 Aneurysm of the ascending aorta, without rupture: Secondary | ICD-10-CM | POA: Diagnosis not present

## 2021-08-31 ENCOUNTER — Ambulatory Visit: Payer: Medicare Other | Admitting: Orthopedic Surgery

## 2021-08-31 ENCOUNTER — Other Ambulatory Visit: Payer: Self-pay

## 2021-08-31 ENCOUNTER — Encounter: Payer: Self-pay | Admitting: Orthopedic Surgery

## 2021-08-31 DIAGNOSIS — M545 Low back pain, unspecified: Secondary | ICD-10-CM | POA: Diagnosis not present

## 2021-08-31 MED ORDER — GABAPENTIN 100 MG PO CAPS: 100.0000 mg | ORAL_CAPSULE | Freq: Two times a day (BID) | ORAL | 0 refills | Status: DC

## 2021-09-02 NOTE — Progress Notes (Signed)
Office Visit Note   Patient: Robert Hensley           Date of Birth: 09-29-1940           MRN: [id number] Visit Date: 08/31/2021 Requested by: Deland Pretty, MD [address] [place],  [place] [address] PCP: Deland Pretty, MD  Subjective: Chief Complaint  Patient presents with   Lower Back - Pain    HPI: Fabrice is an 81 year old patient with right back and hip pain for 3 weeks.  Started after he was working on a [employer].  Reports buttock pain and pain which runs down the leg but no groin pain.  Ambulating with a cane.  He states I have a chronic back problem takes Tylenol as needed.  He was given prednisone injection and Dosepak with some relief.  Hurts him to stand a long time.  He is on Eliquis for A-fib.  Overall he is trending better compared to when his symptoms started              ROS: All systems reviewed are negative as they relate to the chief complaint within the history of present illness.  Patient denies  fevers or chills.   Assessment & Plan: Visit Diagnoses:  1. Acute right-sided low back pain without sciatica     Plan: Impression is right back and hip pain which looks more like facet arthritis with possible foraminal stenosis.  He is getting better.  Add gabapentin for 2 weeks.  Scan in 3 weeks if no better with potential for epidural steroid injections to be done.  We will have to manage his Eliquis around that intervention.  Follow-Up Instructions: Return if symptoms worsen or fail to improve.   Orders:  No orders of the defined types were placed in this encounter.  Meds ordered this encounter  Medications   gabapentin (NEURONTIN) 100 MG capsule    Sig: Take 1 capsule (100 mg total) by mouth 2 (two) times daily.    Dispense:  28 capsule    Refill:  0      Procedures: No procedures performed   Clinical Data: No additional findings.  Objective: Vital Signs: There were no vitals taken for this visit.  Physical Exam:   Constitutional:  Patient appears well-developed HEENT:  Head: Normocephalic Eyes:EOM are normal Neck: Normal range of motion Cardiovascular: Normal rate Pulmonary/chest: Effort normal Neurologic: Patient is alert Skin: Skin is warm Psychiatric: Patient has normal mood and affect   Ortho Exam: Ortho exam demonstrates 5 out of 5 ankle dorsiflexion plantarflexion quad and hamstring strength.  He does have some venous stasis in bilateral lower extremities.  Both feet are warm and perfused.  No nerve root tension signs.  No groin pain with internal/external rotation of either leg.  No definite paresthesias L1-S1 bilaterally.  No trochanteric tenderness is present.  Sciatic notch tenderness present on the right not on the left.  Specialty Comments:  No specialty comments available.  Imaging: No results found.   PMFS History: Patient Active Problem List   Diagnosis Date Noted   Varicose veins of bilateral lower extremities with other complications 74/25/9563   Ascending aorta dilatation ([place]) 05/19/2021   Allergy to penicillin 05/19/2021   Abnormal reflex 05/19/2021   Atherosclerosis of both carotid arteries 05/19/2021   Atrial fibrillation ([place]) 05/19/2021   Body mass index (BMI) 31.0-31.9, adult 05/19/2021   Cardiac arrhythmia 05/19/2021   Diverticulosis of colon 05/19/2021   Dorsopathy, unspecified 05/19/2021   Gout 05/19/2021  History of alcohol use 05/19/2021   History of malignant neoplasm of prostate 05/19/2021   History of tobacco use 05/19/2021   Hypercalcemia 05/19/2021   History of orchiectomy, unilateral 05/19/2021   Hypertensive heart disease without congestive heart failure 05/19/2021   Inclusion cyst 05/19/2021   Internal hemorrhoids 05/19/2021   Paroxysmal ventricular tachycardia 05/19/2021   Ventricular premature contractions 05/19/2021   Tachycardia 05/19/2021   Solitary pulmonary nodule 05/19/2021   Scoliosis 05/19/2021   Psoriasis 05/19/2021   Prediabetes 05/19/2021    Personal history of malignant melanoma of skin 05/19/2021   Peptic ulcer disease 05/19/2021   Abnormal stress test 08/26/2017   Right lower quadrant pain 11/01/2011   HTN (hypertension) 11/01/2011   Hypercholesterolemia 11/01/2011   COPD (chronic obstructive pulmonary disease) ([place]) 11/01/2011   Past Medical History:  Diagnosis Date   A-fib ([place])    Blood transfusion    Complication of anesthesia    Diverticulitis    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[place])    GI bleed    Hyperlipidemia    Hypertension    Pneumonia    Prostate cancer ([place])     Family History  Problem Relation Age of Onset   Liver cancer Sister    Cancer Brother    Heart failure Maternal Aunt    Hypertension Maternal Aunt     Past Surgical History:  Procedure Laterality Date   CARDIOVERSION N/A 09/11/2017   Procedure: CARDIOVERSION;  Surgeon: Nigel Mormon, MD;  Location: [facility];  Service: Cardiovascular;  Laterality: N/A;   cyst removed from back     cyst removed from right knee     LEFT HEART CATH AND CORONARY ANGIOGRAPHY N/A 08/28/2017   Procedure: LEFT HEART CATH AND CORONARY ANGIOGRAPHY;  Surgeon: Nigel Mormon, MD;  Location: [facility];  Service: Cardiovascular;  Laterality: N/A;   PROSTATECTOMY     TESTICLE REMOVAL     Right   TUMOR REMOVAL  1976   Between lung and chest wall   Social History   Occupational History   Occupation: Retired    Fish farm manager: RETIRED  Tobacco Use   Smoking status: Former    Packs/day: 1.00    Years: 25.00    Pack years: 25.00    Types: Cigarettes    Quit date: 09/22/1974    Years since quitting: 46.9   Smokeless tobacco: Former    Types: Chew    Quit date: 07/24/1994  Substance and Sexual Activity   Alcohol use: No   Drug use: No   Sexual activity: Not on file

## 2021-09-22 ENCOUNTER — Encounter: Payer: Self-pay | Admitting: Cardiology

## 2021-09-22 ENCOUNTER — Ambulatory Visit: Payer: Medicare Other | Admitting: Cardiology

## 2021-09-22 ENCOUNTER — Other Ambulatory Visit: Payer: Self-pay

## 2021-09-22 VITALS — BP 145/57 | HR 52 | Temp 98.0°F | Resp 17 | Ht 74.0 in | Wt 246.0 lb

## 2021-09-22 DIAGNOSIS — I480 Paroxysmal atrial fibrillation: Secondary | ICD-10-CM

## 2021-09-22 DIAGNOSIS — I7121 Aneurysm of the ascending aorta, without rupture: Secondary | ICD-10-CM

## 2021-09-22 DIAGNOSIS — I739 Peripheral vascular disease, unspecified: Secondary | ICD-10-CM

## 2021-09-22 DIAGNOSIS — I6523 Occlusion and stenosis of bilateral carotid arteries: Secondary | ICD-10-CM | POA: Diagnosis not present

## 2021-09-22 DIAGNOSIS — I10 Essential (primary) hypertension: Secondary | ICD-10-CM | POA: Diagnosis not present

## 2021-09-22 MED ORDER — OLMESARTAN MEDOXOMIL-HCTZ 40-25 MG PO TABS: 1.0000 | ORAL_TABLET | ORAL | 2 refills | Status: DC

## 2021-09-22 NOTE — Patient Instructions (Signed)
Please go to [facility] and have blood work done in 2 weeks. ? ?Stop benazepril.  Change furosemide how you take it, take it as needed only for leg swelling.  I have started you on a new medication called olmesartan HCT. ? ?I will see you back in 6 weeks following testing on your heart and circulation. ?

## 2021-09-22 NOTE — Progress Notes (Signed)
Primary Physician/Referring:  Merri Brunette, MD  Patient ID: Robert Hensley, male    DOB: Jun 24, 1941, 81 y.o.   MRN: [id number]  Chief Complaint  Patient presents with   Atrial Fibrillation   Follow-up    1 year   Hypertension   HPI:    Robert Hensley  is a 81 y.o. Caucasian male with paroxysmal atrial fibrillation S/P DCCV in 2019, asymptomatic bilateral carotid artery stenosis, hypertension, hyperlipidemia, COPD and prior tobacco use disorder, no significant coronary disease by angiography on 08/28/2017 presents here for annual visit.  He also has chronic PVCs and feels occasional palpitations.    He has cramping in his legs and weakness in his legs right worse than the left, now lifestyle limiting.  He continues to volunteer 2 to 3 days at the [employer] and he used to walk for at least 1 to 2 miles every 3 to 4 days which he has reduced now.  No chest pain, dyspnea has remained stable.  He has mild leg edema which is improved on wearing support stockings and furosemide.  Past Medical History:  Diagnosis Date   A-fib ([facility])    Blood transfusion    Complication of anesthesia    Diverticulitis    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[facility])    GI bleed    Hyperlipidemia    Hypertension    Pneumonia    Prostate cancer ([facility])    Social History   Tobacco Use   Smoking status: Former    Packs/day: 1.00    Years: 25.00    Pack years: 25.00    Types: Cigarettes    Quit date: 09/22/1974    Years since quitting: 47.0   Smokeless tobacco: Former    Types: Chew    Quit date: 07/24/1994  Substance Use Topics   Alcohol use: No   ROS  Review of Systems  Cardiovascular:  Positive for claudication (calf, right worse). Negative for chest pain, dyspnea on exertion, leg swelling and palpitations.  Gastrointestinal:  Negative for melena.  Objective  Blood pressure (!) 145/57, pulse (!) 52, temperature 98 F (36.7 C), temperature source Temporal, resp. rate 17, height 6\' 2"  (1.88 m), weight 246 lb (111.6  kg), SpO2 97 %.  Vitals with BMI 09/22/2021 09/22/2021 05/19/2021  Height - 6\' 2"  6\' 2"   Weight - 246 lbs 250 lbs  BMI - 31.57 32.08  Systolic 145 160 05/21/2021  Diastolic 57 68 67  Pulse 52 61 61     Physical Exam Constitutional:      Appearance: He is obese.  Neck:     Vascular: No JVD.  Cardiovascular:     Rate and Rhythm: Normal rate and regular rhythm.     Pulses:          Carotid pulses are 2+ on the right side with bruit and 2+ on the left side with bruit.      Femoral pulses are 2+ on the right side and 2+ on the left side.      Popliteal pulses are 2+ on the right side and 2+ on the left side.       Dorsalis pedis pulses are 1+ on the right side and 2+ on the left side.       Posterior tibial pulses are 0 on the right side and 0 on the left side.     Heart sounds: Murmur heard.  Early systolic murmur is present with a grade of 2/6 at the upper right sternal border.  No gallop.     Comments: Mild varicose veins bilateral calves Pulmonary:     Effort: Pulmonary effort is normal.     Breath sounds: Normal breath sounds.  Abdominal:     General: Bowel sounds are normal.     Palpations: Abdomen is soft.  Musculoskeletal:     Right lower leg: No edema.     Left lower leg: No edema.   Laboratory examination:    Ref Range & Units 09/22/2019  TSH 0.450 - 4.500 uIU/mL 2.400    External labs:   Labs 07/07/2021: Total cholesterol 143, triglycerides 111, HDL 48, LDL 75.  Non-HDL cholesterol 95.  Labs 06/06/2021: Hb 15.1/HCT 42.9, platelets 229, normal indicis.  Sodium 136, potassium 4.7, BUN 14, creatinine 0.74, EGFR >60 mL.  LFTs normal.   Medications and allergies   Allergies  Allergen Reactions   Penicillins Anaphylaxis and Other (See Comments)    "throat swelling with synthetic penicillins" Has patient had a PCN reaction causing immediate rash, facial/tongue/throat swelling, SOB or lightheadedness with hypotension: No Has patient had a PCN reaction causing severe  rash involving mucus membranes or skin necrosis: No Has patient had a PCN reaction that required hospitalization: Yes Has patient had a PCN reaction occurring within the last 10 years: No If all of the above answers are "NO", then may proceed with Cephalosporin use.    Benzocaine Other (See Comments)    Other reaction(s): hypotensive   Other Other (See Comments)    General Anesthesia - BP drops      Current Outpatient Medications:    acetaminophen (TYLENOL) 500 MG tablet, Take 250-500 mg by mouth daily as needed for moderate pain or headache., Disp: , Rfl:    amiodarone (PACERONE) 100 MG tablet, Take 1 tablet by mouth daily., Disp: , Rfl:    amLODipine (NORVASC) 10 MG tablet, Take 10 mg by mouth daily., Disp: , Rfl:    atorvastatin (LIPITOR) 40 MG tablet, Take 20 mg by mouth daily., Disp: , Rfl:    B Complex Vitamins (B COMPLEX-B12 PO), Take 1 tablet by mouth daily., Disp: , Rfl:    ELIQUIS 5 MG TABS tablet, TAKE 1 TABLET BY MOUTH  TWICE DAILY, Disp: 180 tablet, Rfl: 1   furosemide (LASIX) 20 MG tablet, Take 20 mg by mouth daily as needed. Change to PRN for leg swelling 09/22/21, Disp: , Rfl:    Melatonin 10 MG CAPS, Take 10 mg by mouth at bedtime., Disp: , Rfl:    metoprolol tartrate (LOPRESSOR) 25 MG tablet, Take 12.5 mg by mouth 2 (two) times daily. , Disp: , Rfl:    Multiple Vitamin (MULTIVITAMIN) tablet, Take 1 tablet by mouth daily., Disp: , Rfl:    Multiple Vitamins-Minerals (EQ VISION FORMULA 50+ PO), Take 1 capsule by mouth daily., Disp: , Rfl:    olmesartan-hydrochlorothiazide (BENICAR HCT) 40-25 MG tablet, Take 1 tablet by mouth every morning., Disp: 30 tablet, Rfl: 2   Omega-3 Fatty Acids (FISH OIL) 1200 MG CAPS, Take 1,200 mg by mouth daily. , Disp: , Rfl:    sodium chloride (OCEAN) 0.65 % SOLN nasal spray, Place 1 spray into both nostrils as needed for congestion., Disp: , Rfl:    vitamin C (ASCORBIC ACID) 500 MG tablet, Take 500 mg by mouth daily. , Disp: , Rfl:       Radiology:   CT scan of the chest 04/22/2021: 1. Cardiovascular: There is aneurysmal dilation of the ascending thoracic aorta to approximately 4.5 cm. There are calcifications  of the aortic valve. Three-vessel coronary artery atherosclerotic calcifications. Atherosclerotic calcifications of the aorta. LEFT vertebral artery directly arises from the aorta. Heart is normal in size. No pericardial effusion. 2. No new suspicious pulmonary nodules. Nodule noted on recent chest CT corresponds to a densely calcified pleural plaque with associated scarring. Mild paraseptal emphysema. 3. No AAA.   Cardiac Studies:   Exercise nuclear stress test 09/20/2012 1. Resting EKG showed normal sinus rhythm, poor R wave progression, stress EKG was negative for ischemia. There were frequent PVC and one episode of ventricular triplets with exercise. Patient exercised on Bruce protocol for 6 minutes 00 sec. The maximum work level achieved was 7.3 METs. The baseline blood pressure was 160/80 mmHg and 198/80 mmHg with exercise. The test was terminated due to fatigue and achievement of the target heart rate. 2. Perfusion imaging study demonstrated mild soft tissue attenuation consistent with diaphragmatic attenuation. A small sized inferior scar cannot be excluded. There is no reversibility by polar plot. Dynamic gated images reveal normal wall motion and endocardial thickening. Left ventricular ejection fraction was estimated to be 50%. 3. This represents a low risk study.  Coronary angiogram 08/28/2017: Very mild luminal irregularity, normal LVEF.  Echo- 08/08/2017 1. Left ventricle cavity is normal in size. Mild concentric hypertrophy of the left ventricle. Normal global wall motion. Visual EF is 55-60%. Calculated EF 55%. 2. Mild (Grade I) aortic regurgitation. 3. Trace mitral regurgitation. 4. Trace tricuspid regurgitation. 5. Mildly dilated ascending aorta, measures 3.8 cm.  Carotid artery duplex  03/09/2021: Duplex suggests stenosis in the right internal carotid artery (50-69%). Duplex suggests stenosis in the right external carotid artery (<50%). Duplex suggests stenosis in the left internal carotid artery (50-69%). Duplex suggests stenosis in the left external carotid artery (<50%). Antegrade right vertebral artery flow. Antegrade left vertebral artery flow. Compared to prior studies, no significant change. Follow up in six months is appropriate if clinically indicated.  EKG:  EKG 09/22/2021: Sinus rhythm with first-degree AV block at rate of 51 bpm, left atrial enlargement, left axis deviation, left anterior fascicular block.  Incomplete right bundle branch block.  No significant change from 09/22/2020, 09/22/2019,  EKG 03/21/2019   Assessment     ICD-10-CM   1. Paroxysmal atrial fibrillation (HCC)  I48.0 EKG 12-Lead    PCV ECHOCARDIOGRAM COMPLETE    2. Aneurysm of ascending aorta without rupture  I71.21 PCV ECHOCARDIOGRAM COMPLETE    olmesartan-hydrochlorothiazide (BENICAR HCT) 40-25 MG tablet    3. Essential hypertension  I10 olmesartan-hydrochlorothiazide (BENICAR HCT) 40-25 MG tablet    Basic metabolic panel    4. Claudication in peripheral vascular disease (HCC)  I73.9 PCV LOWER ARTERIAL (BILATERAL)    5. Asymptomatic bilateral carotid artery stenosis  I65.23        Meds ordered this encounter  Medications   olmesartan-hydrochlorothiazide (BENICAR HCT) 40-25 MG tablet    Sig: Take 1 tablet by mouth every morning.    Dispense:  30 tablet    Refill:  2    Medications Discontinued During This Encounter  Medication Reason   amiodarone (PACERONE) 200 MG tablet Change in therapy   gabapentin (NEURONTIN) 100 MG capsule Completed Course   predniSONE (STERAPRED UNI-PAK 21 TAB) 10 MG (21) TBPK tablet Completed Course   hydrochlorothiazide (HYDRODIURIL) 25 MG tablet Change in therapy   benazepril (LOTENSIN) 40 MG tablet Change in therapy     Recommendations:   Robert Hensley  is a 81 y.o. Caucasian male with paroxysmal atrial fibrillation S/P  DCCV in 2019, asymptomatic bilateral carotid artery stenosis, hypertension, hyperlipidemia, COPD and prior tobacco use disorder, no significant coronary disease by angiography on 08/28/2017 presents here for annual visit.  He also has chronic PVCs and feels occasional palpitations.    He has maintained sinus rhythm, no change in the EKG from previous.  Although has bradycardia, remains asymptomatic with regard to this.  Chronic dyspnea is stable.  No clinical evidence of heart failure.  Blood pressure remains uncontrolled, will discontinue benazepril and switch him to Benicar HCT 40/25 mg in the morning.  Previously he was on hydrochlorothiazide, this was discontinued and switched over to Lasix by PCP due to leg edema.  Suspect his leg edema is probably related to obesity, diet and also he may have mild venous insufficiency.  He is presently wearing support stockings.  Check BMP in 2 weeks.  He has reduced his physical activity stating that his right leg cramps.  Left is less.  He has decreased pulses in his right leg compared to the left.  I will obtain lower extremity arterial duplex.  I reviewed his external labs, cholesterol under excellent control, renal function is normal, CBC stable.  However upon review, patient has had a CT scan of the chest now reveals ascending aortic aneurysm measuring 4.5 cm.  I will repeat echocardiogram to see the aortic size, he may need repeat CT scan.  He has bilateral asymptomatic carotid artery stenosis, needs carotid surveillance.  Office visit in 6 weeks.    Adrian Prows, MD, [facility] 09/22/2021, 9:19 AM Office: [phone] Pager: [phone]

## 2021-09-30 ENCOUNTER — Other Ambulatory Visit: Payer: Self-pay

## 2021-09-30 ENCOUNTER — Ambulatory Visit: Payer: Medicare Other

## 2021-09-30 DIAGNOSIS — I6523 Occlusion and stenosis of bilateral carotid arteries: Secondary | ICD-10-CM | POA: Diagnosis not present

## 2021-09-30 DIAGNOSIS — I739 Peripheral vascular disease, unspecified: Secondary | ICD-10-CM | POA: Diagnosis not present

## 2021-10-11 LAB — BASIC METABOLIC PANEL
BUN/Creatinine Ratio: 13 (ref 10–24)
BUN: 11 mg/dL (ref 8–27)
CO2: 26 mmol/L (ref 20–29)
Calcium: 10 mg/dL (ref 8.6–10.2)
Chloride: 96 mmol/L (ref 96–106)
Creatinine, Ser: 0.85 mg/dL (ref 0.76–1.27)
Glucose: 79 mg/dL (ref 70–99)
Potassium: 4.6 mmol/L (ref 3.5–5.2)
Sodium: 135 mmol/L (ref 134–144)
eGFR: 87 mL/min/{1.73_m2} (ref >59)

## 2021-10-24 DIAGNOSIS — H2513 Age-related nuclear cataract, bilateral: Secondary | ICD-10-CM | POA: Diagnosis not present

## 2021-10-24 DIAGNOSIS — H40033 Anatomical narrow angle, bilateral: Secondary | ICD-10-CM | POA: Diagnosis not present

## 2021-11-09 ENCOUNTER — Encounter: Payer: Self-pay | Admitting: Cardiology

## 2021-11-09 ENCOUNTER — Ambulatory Visit: Payer: Medicare Other | Admitting: Cardiology

## 2021-11-09 VITALS — BP 130/64 | HR 57 | Temp 97.0°F | Resp 14 | Ht 74.0 in | Wt 248.0 lb

## 2021-11-09 DIAGNOSIS — I6523 Occlusion and stenosis of bilateral carotid arteries: Secondary | ICD-10-CM

## 2021-11-09 DIAGNOSIS — I739 Peripheral vascular disease, unspecified: Secondary | ICD-10-CM

## 2021-11-09 DIAGNOSIS — I10 Essential (primary) hypertension: Secondary | ICD-10-CM

## 2021-11-09 DIAGNOSIS — I7121 Aneurysm of the ascending aorta, without rupture: Secondary | ICD-10-CM | POA: Diagnosis not present

## 2021-11-09 MED ORDER — OLMESARTAN MEDOXOMIL-HCTZ 40-25 MG PO TABS: 1.0000 | ORAL_TABLET | ORAL | 3 refills | Status: DC

## 2021-11-09 NOTE — Progress Notes (Signed)
? ?Primary Physician/Referring:  Deland Pretty, MD ? ?Patient ID: Robert Hensley, male    DOB: [birth year], 81 y.o.   MRN: [id number] ? ?Chief Complaint  ?Patient presents with  ? Carotid Stenosis  ? Hypertension  ? Claudication  ? Follow-up  ?  6 weeks ?  ? ?HPI:   ? ?Robert Hensley  is a 81 y.o. Caucasian male with paroxysmal atrial fibrillation S/P DCCV in 2019, asymptomatic bilateral carotid artery stenosis, hypertension, hyperlipidemia, COPD and prior tobacco use disorder, no significant coronary disease by angiography on 08/28/2017 presents here for annual visit.  He also has chronic PVCs and feels occasional palpitations.   ? ?He has cramping in his legs and weakness in his legs right worse than the left, now lifestyle limiting.  He continues to volunteer 2 to 3 days at the [employer] and he used to walk for at least 1 to 2 miles every 3 to 4 days which he has reduced now.   ? ?No chest pain, dyspnea has remained stable.  He has mild varicose veins, is presently has not had any leg edema and he is also wearing tight socks. ? ?Patient is here on a 6-week office visit and follow-up of ascending aortic aneurysm that was noted to have increased in size by recent CT scan done about 5 to 6 months ago, hypertension, I had discontinued his ACE inhibitor and switched him over to olmesartan HCT both for hypertension control and also presence of aortic aneurysm, underwent carotid artery duplex and presents for follow-up. ? ?Past Medical History:  ?Diagnosis Date  ? A-fib ([place])   ? Blood transfusion   ? Complication of anesthesia   ? Diverticulitis   ? 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[place])   ? GI bleed   ? Hyperlipidemia   ? Hypertension   ? Pneumonia   ? Prostate cancer ([place])   ? ?Social History  ? ?Tobacco Use  ? Smoking status: Former  ?  Packs/day: 1.00  ?  Years: 25.00  ?  Pack years: 25.00  ?  Types: Cigarettes  ?  Quit date: 09/22/1974  ?  Years since quitting: 47.1  ? Smokeless tobacco: Former  ?  Types: Chew  ?  Quit date:  07/24/1994  ?Substance Use Topics  ? Alcohol use: No  ? ?ROS  ?Review of Systems  ?Cardiovascular:  Positive for claudication (calf, right worse). Negative for chest pain, dyspnea on exertion, leg swelling and palpitations.  ?Gastrointestinal:  Negative for melena.  ?Objective  ?Blood pressure 130/64, pulse (!) 57, temperature (!) 97 ?F (36.1 ?C), temperature source Temporal, resp. rate 14, height _0  (1.88 m), weight 248 lb (112.5 kg), SpO2 97 %.  ? ?  11/09/2021  ?  9:43 AM 11/09/2021  ?  9:10 AM 09/22/2021  ?  8:41 AM  ?Vitals with BMI  ?Height  _1    ?Weight  248 lbs   ?BMI  31.83   ?Systolic 989 211 941  ?Diastolic 64 61 57  ?Pulse  57 52  ?  ? Physical Exam ?Constitutional:   ?   Appearance: He is obese.  ?Neck:  ?   Vascular: No JVD.  ?Cardiovascular:  ?   Rate and Rhythm: Normal rate and regular rhythm.  ?   Pulses:     ?     Carotid pulses are 2+ on the right side with bruit and 2+ on the left side with bruit. ?     Femoral pulses are 2+ on  the right side and 2+ on the left side. ?     Popliteal pulses are 2+ on the right side and 2+ on the left side.  ?     Dorsalis pedis pulses are 1+ on the right side and 2+ on the left side.  ?     Posterior tibial pulses are 0 on the right side and 0 on the left side.  ?   Heart sounds: Murmur heard.  ?Early systolic murmur is present with a grade of 2/6 at the upper right sternal border.  ?  No gallop.  ?   Comments: Mild varicose veins bilateral calves ?Pulmonary:  ?   Effort: Pulmonary effort is normal.  ?   Breath sounds: Normal breath sounds.  ?Abdominal:  ?   General: Bowel sounds are normal.  ?   Palpations: Abdomen is soft.  ?Musculoskeletal:  ?   Right lower leg: No edema.  ?   Left lower leg: No edema.  ? ?Laboratory examination:  ? ? ? ?  Latest Ref Rng & Units 10/10/2021  ? 10:12 AM 02/14/2021  ?  6:52 PM 02/10/2021  ?  2:34 PM  ?BMP  ?Glucose 70 - 99 mg/dL 79   97   107    ?BUN 8 - 27 mg/dL _0 ?Creatinine 0.76 - 1.27 mg/dL 0.85   0.61   0.71     ?BUN/Creat Ratio 10 - 24 13      ?Sodium 134 - 144 mmol/L 135   131   131    ?Potassium 3.5 - 5.2 mmol/L 4.6   3.9   3.8    ?Chloride 96 - 106 mmol/L 96   94   95    ?CO2 20 - 29 mmol/L _1 ?Calcium 8.6 - 10.2 mg/dL 10.0   8.7   9.5    ?  ? Ref Range & Units 09/22/2019  ?TSH 0.450 - 4.500 uIU/mL 2.400   ? ?External labs:  ? ?Labs 07/07/2021: ?Total cholesterol 143, triglycerides 111, HDL 48, LDL 75.  Non-HDL cholesterol 95. ? ?Labs 06/06/2021: ?Hb 15.1/HCT 42.9, platelets 229, normal indicis. ? ?Sodium 136, potassium 4.7, BUN 14, creatinine 0.74, EGFR >60 mL.  LFTs normal. ? ? ?Medications and allergies  ? ?Allergies  ?Allergen Reactions  ? Penicillins Anaphylaxis and Other (See Comments)  ?  "throat swelling with synthetic penicillins" ?Has patient had a PCN reaction causing immediate rash, facial/tongue/throat swelling, SOB or lightheadedness with hypotension: No ?Has patient had a PCN reaction causing severe rash involving mucus membranes or skin necrosis: No ?Has patient had a PCN reaction that required hospitalization: Yes ?Has patient had a PCN reaction occurring within the last 10 years: No ?If all of the above answers are "NO", then may proceed with Cephalosporin use. ?  ? Benzocaine Other (See Comments)  ?  Other reaction(s): hypotensive  ? Other Other (See Comments)  ?  General Anesthesia - BP drops   ?  ? ?Current Outpatient Medications:  ?  acetaminophen (TYLENOL) 500 MG tablet, Take 250-500 mg by mouth daily as needed for moderate pain or headache., Disp: , Rfl:  ?  amiodarone (PACERONE) 100 MG tablet, Take 1 tablet by mouth daily., Disp: , Rfl:  ?  amLODipine (NORVASC) 10 MG tablet, Take 10 mg by mouth daily., Disp: , Rfl:  ?  atorvastatin (LIPITOR) 40 MG tablet, Take 20 mg by mouth  daily., Disp: , Rfl:  ?  B Complex Vitamins (B COMPLEX-B12 PO), Take 1 tablet by mouth daily., Disp: , Rfl:  ?  ELIQUIS 5 MG TABS tablet, TAKE 1 TABLET BY MOUTH  TWICE DAILY, Disp: 180 tablet, Rfl: 1 ?   Melatonin 10 MG CAPS, Take 10 mg by mouth at bedtime., Disp: , Rfl:  ?  metoprolol tartrate (LOPRESSOR) 25 MG tablet, Take 12.5 mg by mouth 2 (two) times daily. , Disp: , Rfl:  ?  Multiple Vitamin (MULTIVITAMIN) tablet, Take 1 tablet by mouth daily., Disp: , Rfl:  ?  Multiple Vitamins-Minerals (EQ VISION FORMULA 50+ PO), Take 1 capsule by mouth daily., Disp: , Rfl:  ?  Omega-3 Fatty Acids (FISH OIL) 1200 MG CAPS, Take 1,200 mg by mouth daily. , Disp: , Rfl:  ?  vitamin C (ASCORBIC ACID) 500 MG tablet, Take 500 mg by mouth daily. , Disp: , Rfl:  ?  olmesartan-hydrochlorothiazide (BENICAR HCT) 40-25 MG tablet, Take 1 tablet by mouth every morning., Disp: 90 tablet, Rfl: 3  ?  ? ?Radiology:  ? ?CT scan of the chest 04/22/2021: ?1. Cardiovascular: There is aneurysmal dilation of the ascending thoracic aorta to approximately 4.5 cm. There are calcifications of ?the aortic valve. Three-vessel coronary artery atherosclerotic calcifications. Atherosclerotic calcifications of the aorta. LEFT ?vertebral artery directly arises from the aorta. Heart is normal in size. No pericardial effusion. ?2. No new suspicious pulmonary nodules. Nodule noted on recent chest CT corresponds to a densely calcified pleural plaque with associated scarring. Mild paraseptal emphysema. ?3. No AAA.  ? ?Cardiac Studies:  ? ?Exercise nuclear stress test 09/20/2012 ?1. Resting EKG showed normal sinus rhythm, poor R wave progression, stress EKG ?was negative for ischemia. There were frequent PVC and one episode of ventricular ?triplets with exercise. Patient exercised on Bruce protocol for 6 minutes 00 sec. The ?maximum work level achieved was 7.3 MET?s. The baseline blood pressure was ?160/80 mmHg and 198/80 mmHg with exercise. The test was terminated due to ?fatigue and achievement of the target heart rate. ?2. Perfusion imaging study demonstrated mild soft tissue attenuation consistent with ?diaphragmatic attenuation. A small sized inferior scar  cannot be excluded. There is no ?reversibility by polar plot. Dynamic gated images reveal normal wall motion and ?endocardial thickening. Left ventricular ejection fraction was estimated to be 50%. ?3. This represents a low

## 2021-11-09 NOTE — H&P (View-Only) (Signed)
? ?Primary Physician/Referring:  Pharr, Walter, MD ? ?Patient ID: Robert Hensley, male    DOB: 06/12/1941, 80 y.o.   MRN: [id number] ? ?Chief Complaint  ?Patient presents with  ? Carotid Stenosis  ? Hypertension  ? Claudication  ? Follow-up  ?  6 weeks ?  ? ?HPI:   ? ?Robert Hensley  is a 80 y.o. Caucasian male with paroxysmal atrial fibrillation S/P DCCV in 2019, asymptomatic bilateral carotid artery stenosis, hypertension, hyperlipidemia, COPD and prior tobacco use disorder, no significant coronary disease by angiography on 08/28/2017 presents here for annual visit.  He also has chronic PVCs and feels occasional palpitations.   ? ?He has cramping in his legs and weakness in his legs right worse than the left, now lifestyle limiting.  He continues to volunteer 2 to 3 days at the [employer] and he used to walk for at least 1 to 2 miles every 3 to 4 days which he has reduced now.   ? ?No chest pain, dyspnea has remained stable.  He has mild varicose veins, is presently has not had any leg edema and he is also wearing tight socks. ? ?Patient is here on a 6-week office visit and follow-up of ascending aortic aneurysm that was noted to have increased in size by recent CT scan done about 5 to 6 months ago, hypertension, I had discontinued his ACE inhibitor and switched him over to olmesartan HCT both for hypertension control and also presence of aortic aneurysm, underwent carotid artery duplex and presents for follow-up. ? ?Past Medical History:  ?Diagnosis Date  ? A-fib ([facility])   ? Blood transfusion   ? Complication of anesthesia   ? Diverticulitis   ? 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[facility])   ? GI bleed   ? Hyperlipidemia   ? Hypertension   ? Pneumonia   ? Prostate cancer ([facility])   ? ?Social History  ? ?Tobacco Use  ? Smoking status: Former  ?  Packs/day: 1.00  ?  Years: 25.00  ?  Pack years: 25.00  ?  Types: Cigarettes  ?  Quit date: 09/22/1974  ?  Years since quitting: 47.1  ? Smokeless tobacco: Former  ?  Types: Chew  ?  Quit date:  07/24/1994  ?Substance Use Topics  ? Alcohol use: No  ? ?ROS  ?Review of Systems  ?Cardiovascular:  Positive for claudication (calf, right worse). Negative for chest pain, dyspnea on exertion, leg swelling and palpitations.  ?Gastrointestinal:  Negative for melena.  ?Objective  ?Blood pressure 130/64, pulse (!) 57, temperature (!) 97 ?F (36.1 ?C), temperature source Temporal, resp. rate 14, height 6' 2" (1.88 m), weight 248 lb (112.5 kg), SpO2 97 %.  ? ?  11/09/2021  ?  9:43 AM 11/09/2021  ?  9:10 AM 09/22/2021  ?  8:41 AM  ?Vitals with BMI  ?Height  6' 2"   ?Weight  248 lbs   ?BMI  31.83   ?Systolic 130 155 145  ?Diastolic 64 61 57  ?Pulse  57 52  ?  ? Physical Exam ?Constitutional:   ?   Appearance: He is obese.  ?Neck:  ?   Vascular: No JVD.  ?Cardiovascular:  ?   Rate and Rhythm: Normal rate and regular rhythm.  ?   Pulses:     ?     Carotid pulses are 2+ on the right side with bruit and 2+ on the left side with bruit. ?     Femoral pulses are 2+ on   the right side and 2+ on the left side. ?     Popliteal pulses are 2+ on the right side and 2+ on the left side.  ?     Dorsalis pedis pulses are 1+ on the right side and 2+ on the left side.  ?     Posterior tibial pulses are 0 on the right side and 0 on the left side.  ?   Heart sounds: Murmur heard.  ?Early systolic murmur is present with a grade of 2/6 at the upper right sternal border.  ?  No gallop.  ?   Comments: Mild varicose veins bilateral calves ?Pulmonary:  ?   Effort: Pulmonary effort is normal.  ?   Breath sounds: Normal breath sounds.  ?Abdominal:  ?   General: Bowel sounds are normal.  ?   Palpations: Abdomen is soft.  ?Musculoskeletal:  ?   Right lower leg: No edema.  ?   Left lower leg: No edema.  ? ?Laboratory examination:  ? ? ? ?  Latest Ref Rng & Units 10/10/2021  ? 10:12 AM 02/14/2021  ?  6:52 PM 02/10/2021  ?  2:34 PM  ?BMP  ?Glucose 70 - 99 mg/dL 79   97   107    ?BUN 8 - 27 mg/dL 11   9   16    ?Creatinine 0.76 - 1.27 mg/dL 0.85   0.61   0.71     ?BUN/Creat Ratio 10 - 24 13      ?Sodium 134 - 144 mmol/L 135   131   131    ?Potassium 3.5 - 5.2 mmol/L 4.6   3.9   3.8    ?Chloride 96 - 106 mmol/L 96   94   95    ?CO2 20 - 29 mmol/L 26   27   27    ?Calcium 8.6 - 10.2 mg/dL 10.0   8.7   9.5    ?  ? Ref Range & Units 09/22/2019  ?TSH 0.450 - 4.500 uIU/mL 2.400   ? ?External labs:  ? ?Labs 07/07/2021: ?Total cholesterol 143, triglycerides 111, HDL 48, LDL 75.  Non-HDL cholesterol 95. ? ?Labs 06/06/2021: ?Hb 15.1/HCT 42.9, platelets 229, normal indicis. ? ?Sodium 136, potassium 4.7, BUN 14, creatinine 0.74, EGFR >60 mL.  LFTs normal. ? ? ?Medications and allergies  ? ?Allergies  ?Allergen Reactions  ? Penicillins Anaphylaxis and Other (See Comments)  ?  "throat swelling with synthetic penicillins" ?Has patient had a PCN reaction causing immediate rash, facial/tongue/throat swelling, SOB or lightheadedness with hypotension: No ?Has patient had a PCN reaction causing severe rash involving mucus membranes or skin necrosis: No ?Has patient had a PCN reaction that required hospitalization: Yes ?Has patient had a PCN reaction occurring within the last 10 years: No ?If all of the above answers are "NO", then may proceed with Cephalosporin use. ?  ? Benzocaine Other (See Comments)  ?  Other reaction(s): hypotensive  ? Other Other (See Comments)  ?  General Anesthesia - BP drops   ?  ? ?Current Outpatient Medications:  ?  acetaminophen (TYLENOL) 500 MG tablet, Take 250-500 mg by mouth daily as needed for moderate pain or headache., Disp: , Rfl:  ?  amiodarone (PACERONE) 100 MG tablet, Take 1 tablet by mouth daily., Disp: , Rfl:  ?  amLODipine (NORVASC) 10 MG tablet, Take 10 mg by mouth daily., Disp: , Rfl:  ?  atorvastatin (LIPITOR) 40 MG tablet, Take 20 mg by mouth   daily., Disp: , Rfl:  ?  B Complex Vitamins (B COMPLEX-B12 PO), Take 1 tablet by mouth daily., Disp: , Rfl:  ?  ELIQUIS 5 MG TABS tablet, TAKE 1 TABLET BY MOUTH  TWICE DAILY, Disp: 180 tablet, Rfl: 1 ?   Melatonin 10 MG CAPS, Take 10 mg by mouth at bedtime., Disp: , Rfl:  ?  metoprolol tartrate (LOPRESSOR) 25 MG tablet, Take 12.5 mg by mouth 2 (two) times daily. , Disp: , Rfl:  ?  Multiple Vitamin (MULTIVITAMIN) tablet, Take 1 tablet by mouth daily., Disp: , Rfl:  ?  Multiple Vitamins-Minerals (EQ VISION FORMULA 50+ PO), Take 1 capsule by mouth daily., Disp: , Rfl:  ?  Omega-3 Fatty Acids (FISH OIL) 1200 MG CAPS, Take 1,200 mg by mouth daily. , Disp: , Rfl:  ?  vitamin C (ASCORBIC ACID) 500 MG tablet, Take 500 mg by mouth daily. , Disp: , Rfl:  ?  olmesartan-hydrochlorothiazide (BENICAR HCT) 40-25 MG tablet, Take 1 tablet by mouth every morning., Disp: 90 tablet, Rfl: 3  ?  ? ?Radiology:  ? ?CT scan of the chest 04/22/2021: ?1. Cardiovascular: There is aneurysmal dilation of the ascending thoracic aorta to approximately 4.5 cm. There are calcifications of ?the aortic valve. Three-vessel coronary artery atherosclerotic calcifications. Atherosclerotic calcifications of the aorta. LEFT ?vertebral artery directly arises from the aorta. Heart is normal in size. No pericardial effusion. ?2. No new suspicious pulmonary nodules. Nodule noted on recent chest CT corresponds to a densely calcified pleural plaque with associated scarring. Mild paraseptal emphysema. ?3. No AAA.  ? ?Cardiac Studies:  ? ?Exercise nuclear stress test 09/20/2012 ?1. Resting EKG showed normal sinus rhythm, poor R wave progression, stress EKG ?was negative for ischemia. There were frequent PVC and one episode of ventricular ?triplets with exercise. Patient exercised on Bruce protocol for 6 minutes 00 sec. The ?maximum work level achieved was 7.3 MET?s. The baseline blood pressure was ?160/80 mmHg and 198/80 mmHg with exercise. The test was terminated due to ?fatigue and achievement of the target heart rate. ?2. Perfusion imaging study demonstrated mild soft tissue attenuation consistent with ?diaphragmatic attenuation. A small sized inferior scar  cannot be excluded. There is no ?reversibility by polar plot. Dynamic gated images reveal normal wall motion and ?endocardial thickening. Left ventricular ejection fraction was estimated to be 50%. ?3. This represents a low

## 2021-11-09 NOTE — Patient Instructions (Signed)
Hold Eliquis previous night and day of procedure ?

## 2021-11-12 LAB — CBC
Hematocrit: 37.9 % (ref 37.5–51.0)
Hemoglobin: 13.1 g/dL (ref 13.0–17.7)
MCH: 30.6 pg (ref 26.6–33.0)
MCHC: 34.6 g/dL (ref 31.5–35.7)
MCV: 89 fL (ref 79–97)
Platelets: 241 10*3/uL (ref 150–450)
RBC: 4.28 x10E6/uL (ref 4.14–5.80)
RDW: 13.1 % (ref 11.6–15.4)
WBC: 6.8 10*3/uL (ref 3.4–10.8)

## 2021-11-12 LAB — BASIC METABOLIC PANEL
BUN/Creatinine Ratio: 15 (ref 10–24)
BUN: 11 mg/dL (ref 8–27)
CO2: 26 mmol/L (ref 20–29)
Calcium: 9.6 mg/dL (ref 8.6–10.2)
Chloride: 96 mmol/L (ref 96–106)
Creatinine, Ser: 0.72 mg/dL — ABNORMAL LOW (ref 0.76–1.27)
Glucose: 95 mg/dL (ref 70–99)
Potassium: 4.5 mmol/L (ref 3.5–5.2)
Sodium: 135 mmol/L (ref 134–144)
eGFR: 91 mL/min/{1.73_m2} (ref >59)

## 2021-11-16 ENCOUNTER — Telehealth: Payer: Self-pay

## 2021-11-16 DIAGNOSIS — H2511 Age-related nuclear cataract, right eye: Secondary | ICD-10-CM | POA: Diagnosis not present

## 2021-11-16 DIAGNOSIS — H353114 Nonexudative age-related macular degeneration, right eye, advanced atrophic with subfoveal involvement: Secondary | ICD-10-CM | POA: Diagnosis not present

## 2021-11-16 NOTE — Telephone Encounter (Signed)
No do not take HCTZ, I combined your HCTZ with Benicar

## 2021-11-16 NOTE — Telephone Encounter (Signed)
Patient stated that the pharmacy will not fill BENICAR HCT because he is already taking HCTZ. Does he need to continue both? Please advise. ?

## 2021-11-17 ENCOUNTER — Ambulatory Visit: Payer: Medicare Other

## 2021-11-17 DIAGNOSIS — I7121 Aneurysm of the ascending aorta, without rupture: Secondary | ICD-10-CM | POA: Diagnosis not present

## 2021-11-17 DIAGNOSIS — I480 Paroxysmal atrial fibrillation: Secondary | ICD-10-CM | POA: Diagnosis not present

## 2021-11-17 NOTE — Telephone Encounter (Signed)
Patient aware.

## 2021-11-22 ENCOUNTER — Other Ambulatory Visit: Payer: Self-pay

## 2021-11-22 ENCOUNTER — Encounter (HOSPITAL_COMMUNITY)
Admission: RE | Disposition: A | Discharge status: Home / Self Care | Payer: Self-pay | Source: Home / Self Care | Attending: Cardiology

## 2021-11-22 ENCOUNTER — Encounter (HOSPITAL_COMMUNITY): Payer: Self-pay | Admitting: Cardiology

## 2021-11-22 ENCOUNTER — Ambulatory Visit (HOSPITAL_COMMUNITY)
Admission: RE | Admit: 2021-11-22 | Discharge: 2021-11-22 | Disposition: A | Discharge status: Home / Self Care | Payer: Medicare Other | Attending: Cardiology | Admitting: Cardiology

## 2021-11-22 DIAGNOSIS — Z79899 Other long term (current) drug therapy: Secondary | ICD-10-CM | POA: Insufficient documentation

## 2021-11-22 DIAGNOSIS — I480 Paroxysmal atrial fibrillation: Secondary | ICD-10-CM | POA: Insufficient documentation

## 2021-11-22 DIAGNOSIS — I10 Essential (primary) hypertension: Secondary | ICD-10-CM | POA: Diagnosis not present

## 2021-11-22 DIAGNOSIS — I6523 Occlusion and stenosis of bilateral carotid arteries: Secondary | ICD-10-CM | POA: Insufficient documentation

## 2021-11-22 DIAGNOSIS — I739 Peripheral vascular disease, unspecified: Secondary | ICD-10-CM | POA: Diagnosis not present

## 2021-11-22 DIAGNOSIS — Z87891 Personal history of nicotine dependence: Secondary | ICD-10-CM | POA: Diagnosis not present

## 2021-11-22 DIAGNOSIS — I7121 Aneurysm of the ascending aorta, without rupture: Secondary | ICD-10-CM | POA: Insufficient documentation

## 2021-11-22 DIAGNOSIS — J449 Chronic obstructive pulmonary disease, unspecified: Secondary | ICD-10-CM | POA: Insufficient documentation

## 2021-11-22 DIAGNOSIS — E785 Hyperlipidemia, unspecified: Secondary | ICD-10-CM | POA: Insufficient documentation

## 2021-11-22 DIAGNOSIS — I701 Atherosclerosis of renal artery: Secondary | ICD-10-CM | POA: Diagnosis not present

## 2021-11-22 HISTORY — PX: ABDOMINAL AORTOGRAM W/LOWER EXTREMITY: CATH118223

## 2021-11-22 SURGERY — ABDOMINAL AORTOGRAM W/LOWER EXTREMITY
Anesthesia: LOCAL | Laterality: Bilateral

## 2021-11-22 MED ORDER — SODIUM CHLORIDE 0.9% FLUSH: 3.0000 mL | INTRAVENOUS | Status: DC | PRN

## 2021-11-22 MED ORDER — SODIUM CHLORIDE 0.9 % IV SOLN: 250.0000 mL | INTRAVENOUS | Status: DC | PRN

## 2021-11-22 MED ORDER — SODIUM CHLORIDE 0.9% FLUSH: 3.0000 mL | Freq: Two times a day (BID) | INTRAVENOUS | Status: DC

## 2021-11-22 MED ORDER — SODIUM CHLORIDE 0.9 % WEIGHT BASED INFUSION: 1.0000 mL/kg/h | INTRAVENOUS | Status: DC

## 2021-11-22 MED ORDER — SODIUM CHLORIDE 0.9 % IV SOLN
INTRAVENOUS | Status: DC
  Administered 2021-11-22: 250 mL via INTRAVENOUS

## 2021-11-22 MED ORDER — SODIUM CHLORIDE 0.9 % IV BOLUS
500.0000 mL | Freq: Once | INTRAVENOUS | Status: AC
  Administered 2021-11-22: 500 mL via INTRAVENOUS

## 2021-11-22 MED ORDER — FENTANYL CITRATE (PF) 100 MCG/2ML IJ SOLN
INTRAMUSCULAR | Status: AC
  Filled 2021-11-22: qty 2

## 2021-11-22 MED ORDER — MIDAZOLAM HCL 2 MG/2ML IJ SOLN
INTRAMUSCULAR | Status: AC
Start: 2021-11-22 — End: ?
  Filled 2021-11-22: qty 2

## 2021-11-22 MED ORDER — HYDRALAZINE HCL 20 MG/ML IJ SOLN: 5.0000 mg | INTRAMUSCULAR | Status: DC | PRN

## 2021-11-22 MED ORDER — ACETAMINOPHEN 325 MG PO TABS: 650.0000 mg | ORAL_TABLET | ORAL | Status: DC | PRN

## 2021-11-22 MED ORDER — LABETALOL HCL 5 MG/ML IV SOLN: 10.0000 mg | INTRAVENOUS | Status: DC | PRN

## 2021-11-22 MED ORDER — HEPARIN (PORCINE) IN NACL 1000-0.9 UT/500ML-% IV SOLN
INTRAVENOUS | Status: AC
  Filled 2021-11-22: qty 1000

## 2021-11-22 MED ORDER — LIDOCAINE HCL (PF) 1 % IJ SOLN
INTRAMUSCULAR | Status: DC | PRN
  Administered 2021-11-22: 10 mL

## 2021-11-22 MED ORDER — FENTANYL CITRATE (PF) 100 MCG/2ML IJ SOLN
INTRAMUSCULAR | Status: DC | PRN
  Administered 2021-11-22: 25 ug via INTRAVENOUS
  Administered 2021-11-22: 50 ug via INTRAVENOUS

## 2021-11-22 MED ORDER — APIXABAN 5 MG PO TABS: 5.0000 mg | ORAL_TABLET | Freq: Two times a day (BID) | ORAL | 1 refills | Status: DC

## 2021-11-22 MED ORDER — LIDOCAINE HCL (PF) 1 % IJ SOLN
INTRAMUSCULAR | Status: AC
  Filled 2021-11-22: qty 30

## 2021-11-22 MED ORDER — HEPARIN (PORCINE) IN NACL 1000-0.9 UT/500ML-% IV SOLN
INTRAVENOUS | Status: DC | PRN
  Administered 2021-11-22 (×2): 500 mL

## 2021-11-22 MED ORDER — ONDANSETRON HCL 4 MG/2ML IJ SOLN
INTRAMUSCULAR | Status: AC
  Filled 2021-11-22: qty 2

## 2021-11-22 MED ORDER — ONDANSETRON HCL 4 MG/2ML IJ SOLN
INTRAMUSCULAR | Status: DC | PRN
  Administered 2021-11-22: 4 mg via INTRAVENOUS

## 2021-11-22 MED ORDER — ATROPINE SULFATE 1 MG/10ML IJ SOSY
PREFILLED_SYRINGE | INTRAMUSCULAR | Status: AC
  Filled 2021-11-22: qty 10

## 2021-11-22 MED ORDER — ONDANSETRON HCL 4 MG/2ML IJ SOLN: 4.0000 mg | Freq: Four times a day (QID) | INTRAMUSCULAR | Status: DC | PRN

## 2021-11-22 MED ORDER — ATROPINE SULFATE 1 MG/10ML IJ SOSY
PREFILLED_SYRINGE | INTRAMUSCULAR | Status: DC | PRN
  Administered 2021-11-22: .5 mg via INTRAVENOUS

## 2021-11-22 MED ORDER — IODIXANOL 320 MG/ML IV SOLN
INTRAVENOUS | Status: DC | PRN
  Administered 2021-11-22: 140 mL

## 2021-11-22 MED ORDER — MIDAZOLAM HCL 2 MG/2ML IJ SOLN
INTRAMUSCULAR | Status: DC | PRN
  Administered 2021-11-22: 2 mg via INTRAVENOUS

## 2021-11-22 SURGICAL SUPPLY — 17 items
CATH ANGIO 5F PIGTAIL 65CM (CATHETERS) ×2 IMPLANT
CATH NAVICROSS ST .035X135CM (MICROCATHETER) ×2 IMPLANT
CATH STRAIGHT 5FR 65CM (CATHETERS) ×2 IMPLANT
CATH TEMPO AQUA 5F 100CM (CATHETERS) ×2 IMPLANT
CLOSURE PERCLOSE PROSTYLE (VASCULAR PRODUCTS) ×2 IMPLANT
GLIDEWIRE NITREX 0.018X80X5 (WIRE) ×3
GUIDEWIRE ANGLED .035X150CM (WIRE) ×2 IMPLANT
GUIDEWIRE NITREX 0.018X80X5 (WIRE) ×1 IMPLANT
KIT MICROPUNCTURE NIT STIFF (SHEATH) ×2 IMPLANT
KIT PV (KITS) ×4 IMPLANT
SHEATH PINNACLE 5F 10CM (SHEATH) ×2 IMPLANT
SHEATH PROBE COVER 6X72 (BAG) ×2 IMPLANT
SYR MEDRAD MARK 7 150ML (SYRINGE) ×4 IMPLANT
TRANSDUCER W/STOPCOCK (MISCELLANEOUS) ×4 IMPLANT
TRAY PV CATH (CUSTOM PROCEDURE TRAY) ×4 IMPLANT
WIRE HITORQ VERSACORE ST 145CM (WIRE) ×2 IMPLANT
WIRE VERSACORE LOC 115CM (WIRE) ×2 IMPLANT

## 2021-11-22 NOTE — Interval H&P Note (Signed)
History and Physical Interval Note: ? ?11/22/2021 ?7:50 AM ? ?Robert Hensley  has presented today for surgery, with the diagnosis of claudication.  The various methods of treatment have been discussed with the patient and family. After consideration of risks, benefits and other options for treatment, the patient has consented to  Procedure(s): ?Lower Extremity Angiography (N/A) and possible angioplasty as a surgical intervention.  The patient's history has been reviewed, patient examined, no change in status, stable for surgery.  I have reviewed the patient's chart and labs.  Questions were answered to the patient's satisfaction.   ? ? ?Adrian Prows ? ? ?

## 2021-11-23 ENCOUNTER — Encounter: Payer: Self-pay | Admitting: Cardiology

## 2021-11-23 NOTE — Progress Notes (Signed)
Normal echocardiogram, aortic root size probably within normal limits for your height.

## 2021-12-02 DIAGNOSIS — H2513 Age-related nuclear cataract, bilateral: Secondary | ICD-10-CM | POA: Diagnosis not present

## 2021-12-02 DIAGNOSIS — Z961 Presence of intraocular lens: Secondary | ICD-10-CM | POA: Diagnosis not present

## 2021-12-02 DIAGNOSIS — H2511 Age-related nuclear cataract, right eye: Secondary | ICD-10-CM | POA: Diagnosis not present

## 2021-12-07 ENCOUNTER — Other Ambulatory Visit: Payer: Self-pay

## 2021-12-07 DIAGNOSIS — I6523 Occlusion and stenosis of bilateral carotid arteries: Secondary | ICD-10-CM

## 2021-12-07 DIAGNOSIS — I480 Paroxysmal atrial fibrillation: Secondary | ICD-10-CM

## 2021-12-07 DIAGNOSIS — I7121 Aneurysm of the ascending aorta, without rupture: Secondary | ICD-10-CM

## 2021-12-08 ENCOUNTER — Other Ambulatory Visit: Payer: Medicare Other

## 2021-12-08 DIAGNOSIS — I739 Peripheral vascular disease, unspecified: Secondary | ICD-10-CM

## 2021-12-08 DIAGNOSIS — I7121 Aneurysm of the ascending aorta, without rupture: Secondary | ICD-10-CM

## 2021-12-08 DIAGNOSIS — I480 Paroxysmal atrial fibrillation: Secondary | ICD-10-CM

## 2021-12-08 DIAGNOSIS — I6523 Occlusion and stenosis of bilateral carotid arteries: Secondary | ICD-10-CM

## 2021-12-14 ENCOUNTER — Ambulatory Visit: Payer: Medicare Other | Admitting: Cardiology

## 2021-12-14 ENCOUNTER — Encounter: Payer: Self-pay | Admitting: Cardiology

## 2021-12-14 VITALS — BP 138/61 | HR 58 | Temp 98.0°F | Resp 14 | Ht 73.0 in | Wt 246.0 lb

## 2021-12-14 DIAGNOSIS — I480 Paroxysmal atrial fibrillation: Secondary | ICD-10-CM

## 2021-12-14 DIAGNOSIS — I739 Peripheral vascular disease, unspecified: Secondary | ICD-10-CM

## 2021-12-14 DIAGNOSIS — I10 Essential (primary) hypertension: Secondary | ICD-10-CM | POA: Diagnosis not present

## 2021-12-14 DIAGNOSIS — I7121 Aneurysm of the ascending aorta, without rupture: Secondary | ICD-10-CM

## 2021-12-14 NOTE — Progress Notes (Signed)
Primary Physician/Referring:  Deland Pretty, MD  Patient ID: Robert Hensley, male    DOB: January 07, 1941, 81 y.o.   MRN: [id number]  Chief Complaint  Patient presents with   Paroxysmal atrial fibrillation   Follow-up    Post procedure   Claudication   HPI:    Robert Hensley  is a 81 y.o. Caucasian male with paroxysmal atrial fibrillation S/P DCCV in 2019, asymptomatic bilateral carotid artery stenosis, hypertension, hyperlipidemia, COPD and prior tobacco use disorder, no significant coronary disease by angiography on 08/28/2017 presents here for 6 week visit for claudication. He also has chronic PVCs and feels occasional palpitations.    He has cramping in his legs and weakness in his legs right worse than the left, now lifestyle limiting.  He continues to volunteer 2 to 3 days at the [employer] and he used to walk for at least 1 to 2 miles every 3 to 4 days which he has reduced now. I had scheduled him for peripheral arteriogram which he underwent on 11/22/2021.  Patient states that since angiography his symptoms of claudication have improved and he would like to continue medical therapy as long as there is no critical limb ischemia or risk of limb loss.  No chest pain, dyspnea has remained stable.  He has mild varicose veins, is presently has not had any leg edema and he is also wearing tight socks.  Past Medical History:  Diagnosis Date   A-fib ([place])    Blood transfusion    Complication of anesthesia    Diverticulitis    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[place])    GI bleed    Hyperlipidemia    Hypertension    Pneumonia    Prostate cancer ([place])    Social History   Tobacco Use   Smoking status: Former    Packs/day: 1.00    Years: 25.00    Pack years: 25.00    Types: Cigarettes    Quit date: 09/22/1974    Years since quitting: 47.2   Smokeless tobacco: Former    Types: Chew    Quit date: 07/24/1994  Substance Use Topics   Alcohol use: No   ROS  Review of Systems  Cardiovascular:  Positive  for claudication (calf, right worse). Negative for chest pain, dyspnea on exertion, leg swelling and palpitations.  Gastrointestinal:  Negative for melena.  Objective  Blood pressure 138/61, pulse (!) 58, temperature 98 F (36.7 C), temperature source Temporal, resp. rate 14, height '6\' 1"'  (1.854 m), weight 246 lb (111.6 kg), SpO2 98 %.     12/14/2021    8:52 AM 11/22/2021   11:18 AM 11/22/2021   10:48 AM  Vitals with BMI  Height '6\' 1"'     Weight 246 lbs    BMI 22.97    Systolic 989 211 941  Diastolic 61 59 59  Pulse 58 70 69     Physical Exam Constitutional:      Appearance: He is obese.  Neck:     Vascular: No JVD.  Cardiovascular:     Rate and Rhythm: Normal rate and regular rhythm.     Pulses:          Carotid pulses are 2+ on the right side with bruit and 2+ on the left side with bruit.      Femoral pulses are 2+ on the right side and 2+ on the left side.      Popliteal pulses are 2+ on the right side and 2+ on the  left side.       Dorsalis pedis pulses are 1+ on the right side and 2+ on the left side.       Posterior tibial pulses are 0 on the right side and 0 on the left side.     Heart sounds: Murmur heard.  Early systolic murmur is present with a grade of 2/6 at the upper right sternal border.    No gallop.     Comments: Mild varicose veins bilateral calves Pulmonary:     Effort: Pulmonary effort is normal.     Breath sounds: Normal breath sounds.  Abdominal:     General: Bowel sounds are normal.     Palpations: Abdomen is soft.  Musculoskeletal:     Right lower leg: No edema.     Left lower leg: No edema.   Laboratory examination:   Recent Results (from the past 2160 hour(s))  Basic metabolic panel     Status: None   Collection Time: 10/10/21 10:12 AM  Result Value Ref Range   Glucose 79 70 - 99 mg/dL   BUN 11 8 - 27 mg/dL   Creatinine, Ser 0.85 0.76 - 1.27 mg/dL   eGFR 87 >59 mL/min/1.73   BUN/Creatinine Ratio 13 10 - 24   Sodium 135 134 - 144 mmol/L    Potassium 4.6 3.5 - 5.2 mmol/L   Chloride 96 96 - 106 mmol/L   CO2 26 20 - 29 mmol/L   Calcium 10.0 8.6 - 10.2 mg/dL  CBC     Status: None   Collection Time: 11/11/21  9:39 AM  Result Value Ref Range   WBC 6.8 3.4 - 10.8 x10E3/uL   RBC 4.28 4.14 - 5.80 x10E6/uL   Hemoglobin 13.1 13.0 - 17.7 g/dL   Hematocrit 37.9 37.5 - 51.0 %   MCV 89 79 - 97 fL   MCH 30.6 26.6 - 33.0 pg   MCHC 34.6 31.5 - 35.7 g/dL   RDW 13.1 11.6 - 15.4 %   Platelets 241 150 - 450 [id number]  Basic metabolic panel     Status: Abnormal   Collection Time: 11/11/21  9:39 AM  Result Value Ref Range   Glucose 95 70 - 99 mg/dL   BUN 11 8 - 27 mg/dL   Creatinine, Ser 0.72 (L) 0.76 - 1.27 mg/dL   eGFR 91 >59 mL/min/1.73   BUN/Creatinine Ratio 15 10 - 24   Sodium 135 134 - 144 mmol/L   Potassium 4.5 3.5 - 5.2 mmol/L   Chloride 96 96 - 106 mmol/L   CO2 26 20 - 29 mmol/L   Calcium 9.6 8.6 - 10.2 mg/dL     Ref Range & Units 09/22/2019  TSH 0.450 - 4.500 uIU/mL 2.400    External labs:   Labs 07/07/2021: Total cholesterol 143, triglycerides 111, HDL 48, LDL 75.  Non-HDL cholesterol 95.  Labs 06/06/2021: Hb 15.1/HCT 42.9, platelets 229, normal indicis.  Sodium 136, potassium 4.7, BUN 14, creatinine 0.74, EGFR >60 mL.  LFTs normal.   Medications and allergies   Allergies  Allergen Reactions   Penicillins Anaphylaxis and Other (See Comments)    "throat swelling with synthetic penicillins" Has patient had a PCN reaction causing immediate rash, facial/tongue/throat swelling, SOB or lightheadedness with hypotension: No Has patient had a PCN reaction causing severe rash involving mucus membranes or skin necrosis: No Has patient had a PCN reaction that required hospitalization: Yes Has patient had a PCN reaction occurring within the last 10 years: No If  all of the above answers are "NO", then may proceed with Cephalosporin use.    Benzocaine Other (See Comments)    Hypotensive -Pt unaware of this allergy     Other Other (See Comments)    General Anesthesia - BP drops      Current Outpatient Medications:    albuterol (PROVENTIL) (2.5 MG/3ML) 0.083% nebulizer solution, Take 2.5 mg by nebulization every 6 (six) hours as needed for wheezing or shortness of breath., Disp: , Rfl:    amiodarone (PACERONE) 100 MG tablet, Take 100 mg by mouth daily., Disp: , Rfl:    amLODipine (NORVASC) 10 MG tablet, Take 10 mg by mouth daily., Disp: , Rfl:    apixaban (ELIQUIS) 5 MG TABS tablet, Take 1 tablet (5 mg total) by mouth 2 (two) times daily., Disp: 180 tablet, Rfl: 1   atorvastatin (LIPITOR) 40 MG tablet, Take 40 mg by mouth daily., Disp: , Rfl:    B Complex Vitamins (B COMPLEX-B12 PO), Take 1 tablet by mouth daily., Disp: , Rfl:    diphenhydramine-acetaminophen (TYLENOL PM) 25-500 MG TABS tablet, Take 1 tablet by mouth at bedtime., Disp: , Rfl:    melatonin 5 MG TABS, Take 5 mg by mouth at bedtime., Disp: , Rfl:    metoprolol tartrate (LOPRESSOR) 25 MG tablet, Take 12.5 mg by mouth 2 (two) times daily. , Disp: , Rfl:    Multiple Vitamin (MULTIVITAMIN) tablet, Take 1 tablet by mouth daily., Disp: , Rfl:    Multiple Vitamins-Minerals (EQ VISION FORMULA 50+ PO), Take 1 capsule by mouth daily., Disp: , Rfl:    ofloxacin (OCUFLOX) 0.3 % ophthalmic solution, Place 1-2 drops into the left eye., Disp: , Rfl:    olmesartan-hydrochlorothiazide (BENICAR HCT) 40-25 MG tablet, Take 1 tablet by mouth every morning., Disp: 90 tablet, Rfl: 3   Omega-3 Fatty Acids (FISH OIL) 1200 MG CAPS, Take 1,200 mg by mouth daily. , Disp: , Rfl:    prednisoLONE acetate (PRED FORTE) 1 % ophthalmic suspension, Place 1-2 drops into the right eye., Disp: , Rfl:    vitamin C (ASCORBIC ACID) 500 MG tablet, Take 500 mg by mouth daily. , Disp: , Rfl:      Radiology:   CT scan of the chest 04/22/2021: 1. Cardiovascular: There is aneurysmal dilation of the ascending thoracic aorta to approximately 4.5 cm. There are calcifications of the aortic  valve. Three-vessel coronary artery atherosclerotic calcifications. Atherosclerotic calcifications of the aorta. LEFT vertebral artery directly arises from the aorta. Heart is normal in size. No pericardial effusion. 2. No new suspicious pulmonary nodules. Nodule noted on recent chest CT corresponds to a densely calcified pleural plaque with associated scarring. Mild paraseptal emphysema. 3. No AAA.   Cardiac Studies:   Coronary angiogram 08/28/2017: Very mild luminal irregularity, normal LVEF.  Lower Extremity Arterial Duplex 09/30/2021: Moderate velocity increase at the right mid superficial femoral artery suggest hemodynamically significant stenosis of >50%. There is moderate heterogeneous plaque noted.  No hemodynamically significant stenosis are identified in the left lower extremity arterial system. This exam reveals normal perfusion of the right lower extremity (ABI 1.12) and normal perfusion of the left lower extremity (ABI 1.24).  Carotid artery duplex 09/30/2021: Duplex suggests stenosis in the right internal carotid artery (50-69%). Duplex suggests stenosis in the right external carotid artery (<50%). Duplex suggests stenosis in the left internal carotid artery (50-69%). Duplex suggests stenosis in the left external carotid artery (<50%). Antegrade right vertebral artery flow. Antegrade left vertebral artery flow. No significant change  compared to 03/09/2021. Follow up in six months is appropriate if clinically indicated.  Peripheral arteriogram 11/22/2021: Abdominal exam reveals presence of 2 renal arteries 1 on either sides, left renal artery also has a superior pole small renal artery.  Left renal main artery has mild calcific 20 to 30% stenosis.  Abdominal aorta shows mild diffuse calcific plaque.  Aortoiliac bifurcation is widely patent.   Right iliac artery has mild disease, right external iliac artery shows moderate amount of tortuosity.  The right common femoral artery arises  much more proximal than usual.  Right SFA in the Hunter's canal shows cauliflower-like multiple lesions but of no hemodynamic consequence, at most a 15 to 20 mmHg pressure gradient constituting a 40 to 50% stenosis.  There is three-vessel runoff below the right knee.  Left iliac artery has mild disease.  Left external iliac artery is again mildly tortuous.  Left femoral arteriogram distal runoff revealed mild disease in the left SFA much less significant than the right with again calcific spicules noted.  There is three-vessel runoff below the left knee.  Impression: Moderate PAD especially involving the right SFA, not a huge pressure gradient across the lesions, best treated medically, evaluation for pseudoclaudication indicated.      ABI 12/08/2021: This exam reveals normal perfusion of the right lower extremity (ABI 1.13).   This exam reveals normal perfusion of the left lower extremity (ABI 1.06). There is normal multiphasic waveform at the ankles.   No significant change in the ABI since 09/30/2021.  Please complete lower extremity arterial duplex was performed on 09/30/2021, had also suggested >50% stenosis of the right SFA.  PCV ECHOCARDIOGRAM COMPLETE 11/17/2021  Narrative Echocardiogram 11/17/2021: Normal LV systolic function with visual EF 55-60%. Left ventricle cavity is normal in size. Normal left ventricular wall thickness. Normal global wall motion. Normal diastolic filling pattern, normal LAP. Mild (Grade I) aortic regurgitation. Aortic valve sclerosis without stenosis. The aortic root and proximal ascending aorta slightly above normal limits (Sinus of Valsalva 27m and proximal ascending aorta 357m Compared to 08/08/2017 no significant change.     EKG:  EKG 09/22/2021: Sinus rhythm with first-degree AV block at rate of 51 bpm, left atrial enlargement, left axis deviation, left anterior fascicular block.  Incomplete right bundle branch block.  No significant change from  09/22/2020, 09/22/2019,  EKG 03/21/2019   Assessment     ICD-10-CM   1. Claudication in peripheral vascular disease (HCC)  I73.9     2. Aneurysm of ascending aorta without rupture (HCC)  I71.21     3. Paroxysmal atrial fibrillation (HCC)  I48.0 CANCELED: EKG 12-Lead    4. Primary hypertension  I10        No orders of the defined types were placed in this encounter.   Medications Discontinued During This Encounter  Medication Reason   hydrochlorothiazide (HYDRODIURIL) 25 MG tablet Change in therapy     Recommendations:   JaZADEN SAKOis a [age].o. Caucasian male with paroxysmal atrial fibrillation S/P DCCV in 2019, asymptomatic bilateral carotid artery stenosis, hypertension, hyperlipidemia, COPD and prior tobacco use disorder, no significant coronary disease by angiography on 08/28/2017 presents here for annual visit.  He also has chronic PVCs and feels occasional palpitations.    I seen the patient about 6 weeks ago, due to persistent symptoms of claudication, I had scheduled him for peripheral arteriogram which he underwent on 11/22/2021.  I reviewed the results of the angiogram with the patient, I had underestimated the severity  of the stenosis, clearly he has diffuse 50 to 60% stenosis in the right mid SFA but there is a focal tandem lesions of at least 80% which are calcific.  He does have three-vessel runoff below the knee.  I discussed with him regarding proceeding with intervention to the same to improve the quality of his life.  Patient states that since angiography his symptoms of claudication have improved and he would like to continue medical therapy as long as there is no critical limb ischemia or risk of limb loss.  Hence we will continue medical therapy for now.  His left groin site has healed well without any hematoma or ecchymosis.  Ideally I would like to start him on Pletal for claudication however he is on Eliquis for atrial fibrillation.  Although he is maintaining sinus  rhythm, due to high CHA2DS2-VASc risk score, we should continue anticoagulation that has been started in 2019.  He is also on low-dose amiodarone.  His blood pressure is well controlled, in fact blood pressures at home has been around 120 mmHg range.  Hence I did not make any changes to his medication.  He does have ascending aorta aneurysm of 4.5 cm by CT scan, recent echocardiogram does not correlate with this, hence he will need probably annual CT scan and this has already been scheduled.  I would like to see him back in 6 months for follow-up.  He has carotid artery stenosis bilaterally of moderate range, he was scheduled for repeat carotid artery duplex, patient prefers to change this to every year instead of every 6 months, hence I will move the test to sometime next year.   Adrian Prows, MD, [facility] 12/14/2021, 9:31 AM Office: [phone] Pager: [phone]

## 2021-12-16 DIAGNOSIS — H2513 Age-related nuclear cataract, bilateral: Secondary | ICD-10-CM | POA: Diagnosis not present

## 2021-12-16 DIAGNOSIS — Z961 Presence of intraocular lens: Secondary | ICD-10-CM | POA: Diagnosis not present

## 2021-12-16 DIAGNOSIS — H2512 Age-related nuclear cataract, left eye: Secondary | ICD-10-CM | POA: Diagnosis not present

## 2022-01-09 DIAGNOSIS — H43393 Other vitreous opacities, bilateral: Secondary | ICD-10-CM | POA: Diagnosis not present

## 2022-01-11 ENCOUNTER — Other Ambulatory Visit: Payer: Self-pay | Admitting: Cardiology

## 2022-02-07 ENCOUNTER — Encounter: Admit: 2022-02-07 | Discharge: 2022-02-07

## 2022-02-07 NOTE — Telephone Encounter
Left a message that he called the wrong number.  This is the eye clinic.  No Dr. Wilson Singer here.

## 2022-03-23 DIAGNOSIS — I7121 Aneurysm of the ascending aorta, without rupture: Secondary | ICD-10-CM | POA: Diagnosis not present

## 2022-03-23 DIAGNOSIS — I119 Hypertensive heart disease without heart failure: Secondary | ICD-10-CM | POA: Diagnosis not present

## 2022-03-23 DIAGNOSIS — I10 Essential (primary) hypertension: Secondary | ICD-10-CM | POA: Diagnosis not present

## 2022-03-23 DIAGNOSIS — E7800 Pure hypercholesterolemia, unspecified: Secondary | ICD-10-CM | POA: Diagnosis not present

## 2022-03-31 ENCOUNTER — Other Ambulatory Visit: Payer: Self-pay | Admitting: Surgery

## 2022-03-31 DIAGNOSIS — I712 Thoracic aortic aneurysm, without rupture, unspecified: Secondary | ICD-10-CM

## 2022-05-11 ENCOUNTER — Other Ambulatory Visit: Payer: Medicare Other

## 2022-05-17 ENCOUNTER — Ambulatory Visit: Payer: Medicare Other | Admitting: Physician Assistant

## 2022-05-17 ENCOUNTER — Encounter: Payer: Self-pay | Admitting: Physician Assistant

## 2022-05-17 ENCOUNTER — Ambulatory Visit
Admission: RE | Admit: 2022-05-17 | Discharge: 2022-05-17 | Disposition: A | Discharge status: Home / Self Care | Payer: Medicare Other | Source: Ambulatory Visit | Attending: Surgery | Admitting: Surgery

## 2022-05-17 VITALS — BP 160/57 | HR 64 | Resp 20 | Ht 73.0 in | Wt 253.0 lb

## 2022-05-17 DIAGNOSIS — I3139 Other pericardial effusion (noninflammatory): Secondary | ICD-10-CM | POA: Diagnosis not present

## 2022-05-17 DIAGNOSIS — I7121 Aneurysm of the ascending aorta, without rupture: Secondary | ICD-10-CM

## 2022-05-17 DIAGNOSIS — J439 Emphysema, unspecified: Secondary | ICD-10-CM | POA: Diagnosis not present

## 2022-05-17 DIAGNOSIS — J9811 Atelectasis: Secondary | ICD-10-CM | POA: Diagnosis not present

## 2022-05-17 DIAGNOSIS — I712 Thoracic aortic aneurysm, without rupture, unspecified: Secondary | ICD-10-CM

## 2022-05-17 DIAGNOSIS — I700 Atherosclerosis of aorta: Secondary | ICD-10-CM | POA: Diagnosis not present

## 2022-05-17 DIAGNOSIS — R911 Solitary pulmonary nodule: Secondary | ICD-10-CM | POA: Diagnosis not present

## 2022-05-17 DIAGNOSIS — R918 Other nonspecific abnormal finding of lung field: Secondary | ICD-10-CM | POA: Diagnosis not present

## 2022-05-17 NOTE — Progress Notes (Signed)
[address]       [place] [address]             [phone]        Robert Hensley [id number] 1941/04/10   History of Present Illness:  Robert Hensley is an 82 year old male with a past medical history of atrial fibrillation, COPD, HTN, HLD, and asymptomatic bilateral carotid stenosis. He is in the clinic today for annual surveillance of his ascending aortic aneurysm. Last year when following up on a left upper lobe nodule he was incidentally found to have a 4.5cm ascending thoracic aortic aneurysm. He denies chest pain, chest tightness, dizziness and LOC. He admits to some shortness of breath due to his COPD but no new or worsening symptoms. He is closely followed by his cardiologist Robert Hensley and his PCP. Echocardiogram on 11/17/21 reads as a normal echo with mild aortic regurgitation, likely tricuspid aortic valve.    Current Outpatient Medications on File Prior to Visit  Medication Sig Dispense Refill   albuterol (PROVENTIL) (2.5 MG/3ML) 0.083% nebulizer solution Take 2.5 mg by nebulization every 6 (six) hours as needed for wheezing or shortness of breath.     amiodarone (PACERONE) 100 MG tablet Take 100 mg by mouth daily.     amLODipine (NORVASC) 10 MG tablet Take 10 mg by mouth daily.     atorvastatin (LIPITOR) 40 MG tablet Take 40 mg by mouth daily.     B Complex Vitamins (B COMPLEX-B12 PO) Take 1 tablet by mouth daily.     diphenhydramine-acetaminophen (TYLENOL PM) 25-500 MG TABS tablet Take 1 tablet by mouth at bedtime.     ELIQUIS 5 MG TABS tablet TAKE 1 TABLET BY MOUTH TWICE  DAILY 180 tablet 3   melatonin 5 MG TABS Take 5 mg by mouth at bedtime.     metoprolol tartrate (LOPRESSOR) 25 MG tablet Take 12.5 mg by mouth 2 (two) times daily.      Multiple Vitamin (MULTIVITAMIN) tablet Take 1 tablet by mouth daily.     Multiple Vitamins-Minerals (EQ VISION FORMULA 50+ PO) Take 1 capsule by mouth daily.     ofloxacin (OCUFLOX) 0.3 % ophthalmic solution Place 1-2  drops into the left eye.     olmesartan-hydrochlorothiazide (BENICAR HCT) 40-25 MG tablet Take 1 tablet by mouth every morning. 90 tablet 3   Omega-3 Fatty Acids (FISH OIL) 1200 MG CAPS Take 1,200 mg by mouth daily.      prednisoLONE acetate (PRED FORTE) 1 % ophthalmic suspension Place 1-2 drops into the right eye.     vitamin C (ASCORBIC ACID) 500 MG tablet Take 500 mg by mouth daily.      No current facility-administered medications on file prior to visit.    Today's Vitals   05/17/22 1356  BP: (!) 160/57  Pulse: 64  Resp: 20  SpO2: 96%  Weight: 253 lb (114.8 kg)  Height: '6\' 1"'$  (1.854 m)   Body mass index is 33.38 kg/m.   Physical Exam Gen: Alert, no distress Neuro: grossly intact CV: regular rate and rhythm, no murmur, rub or gallop Pulm: Wheezing throughout GI: +BS, no tenderness, no distension Extremities: 1+ edema, 2+ radial pulses bilaterally  CTA Results:  CLINICAL DATA:  Aortic aneurysm and lung nodule follow-up   EXAM: CT CHEST WITHOUT CONTRAST   TECHNIQUE: Multidetector CT imaging of the chest was performed following the standard protocol without IV contrast.   RADIATION DOSE REDUCTION: This exam was performed  according to the departmental dose-optimization program which includes automated exposure control, adjustment of the mA and/or kV according to patient size and/or use of iterative reconstruction technique.   COMPARISON:  CT chest dated September 30th 2022   FINDINGS: Cardiovascular: Heart size. Pericardial effusion. Severe left main and three-vessel coronary artery calcifications. Dilated ascending thoracic aorta measuring 4.6 by 4.4 cm unchanged when compared with prior exam. Moderate calcified plaque of the thoracic aorta.   Mediastinum/Nodes: Small hiatal hernia. Thyroid is unremarkable. No pathologically enlarged lymph nodes seen in the chest.   Lungs/Pleura: Central airways are patent. Mild paraseptal emphysema. Left-greater-than-right  calcified pleural plaques. Mild basilar subpleural reticular opacities with associated honeycomb change. Unchanged round atelectasis of the left lower lobe. No consolidation, pleural effusion or pneumothorax. Small bilateral solid pulmonary nodules, largest measures 4 mm and is located in the right lower lobe on image 128, stable in size when compared with the prior exam, no further follow-up imaging needed per Fleischner guidelines given greater than 1 year stability.   Upper Abdomen: No acute abnormality.   Musculoskeletal: No chest wall mass or suspicious bone lesions identified.   IMPRESSION: 1. Dilated ascending thoracic aorta measuring 4.6 cm, unchanged when compared with prior exam. Ascending thoracic aortic aneurysm. Recommend semi-annual imaging followup by CTA or MRA and referral to cardiothoracic surgery if not already obtained. This recommendation follows 2010 ACCF/AHA/AATS/ACR/ASA/SCA/SCAI/SIR/STS/SVM Guidelines for the Diagnosis and Management of Patients With Thoracic Aortic Disease. Circulation. 2010; 121: X902-I097. Aortic aneurysm NOS (ICD10-I71.9) 2. Unchanged left-greater-than-right calcified pleural plaques, likely postsurgical. 3. Mild basilar subpleural reticular opacities with possible associated honeycomb change, findings are similar to prior exam and can be seen in setting of interstitial lung abnormality. Correlate with patient's systems and consider pulmonary function tests for further evaluation. 4. Severe left main and three-vessel coronary artery calcifications. 5. Aortic Atherosclerosis (ICD10-I70.0) and Emphysema (ICD10-J43.9).     Electronically Signed   By: Robert Hensley M.D.   On: 05/17/2022 14:16    A/P: Ascending thoracic aortic aneurysm:  Robert Hensley presents with an ascending aortic aneurysm that was 4.5cm last year and measured 4.6cm this year. He denies chest pain, chest tightness, dizziness, LOC and shortness of breath. Elevated BP  in clinic today 160/57, states he checks it at home and it is usually 353 systolic. In May at the cardiologist it was continuously 299-242 systolic. Will not make medication changes at this time but recommended discussing it with his cardiologist or PCP at next appointment if it remains elevated and to call his PCP if it remains elevated at home. 4.6cm ascending aortic aneurysm does not meet surgical threshold. Will plan for 6 month follow up with CTA with a surgeon.  RUL nodule: Remains stable since 2013, will continue to monitor on CTA.   Coronary artery calcifications: Discussed diet and exercise. Continue close follow up with cardiology.   Risk Modification:  Statin:  atorvastatin '40mg'$   Smoking cessation instruction/counseling given: former smoker  Patient was counseled on importance of Blood Pressure Control.  Despite Medical intervention if the patient notices persistently elevated blood pressure readings.  They are instructed to contact their Primary Care Physician  Please avoid use of Fluoroquinolones as this can potentially increase your risk of Aortic Rupture and/or Dissection  Patient educated on signs and symptoms of Aortic Dissection, handout also provided in AVS  Magdalene River, PA-C 05/17/22

## 2022-05-17 NOTE — Patient Instructions (Signed)
Risk Modification in those with ascending thoracic aortic aneurysm:  Continue good control of blood pressure (prefer SBP 130/80 or less)  2. Avoid fluoroquinolone antibiotics (I.e Ciprofloxacin, Avelox, Levofloxacin, Ofloxacin)  3.  Use of statin (to decrease cardiovascular risk)  4.  Exercise and activity limitations is individualized, but in general, contact sports are to be  avoided and one should avoid heavy lifting (defined as half of ideal body weight) and exercises involving sustained Valsalva maneuver.  5. Counseling for those suspected of having genetically mediated disease. First-degree relatives of those with TAA disease should be screened as well as those who have a connective tissue disease (I.e with Marfan syndrome, Ehlers-Danlos syndrome,  and Loeys-Dietz syndrome) or a  bicuspid aortic valve,have an increased risk for  complications related to TAA  6. If one has tobacco abuse, smoking cessation is highly encouraged.  

## 2022-06-08 DIAGNOSIS — I10 Essential (primary) hypertension: Secondary | ICD-10-CM | POA: Diagnosis not present

## 2022-06-08 DIAGNOSIS — I739 Peripheral vascular disease, unspecified: Secondary | ICD-10-CM | POA: Diagnosis not present

## 2022-06-08 DIAGNOSIS — E7800 Pure hypercholesterolemia, unspecified: Secondary | ICD-10-CM | POA: Diagnosis not present

## 2022-06-13 DIAGNOSIS — D692 Other nonthrombocytopenic purpura: Secondary | ICD-10-CM | POA: Diagnosis not present

## 2022-06-13 DIAGNOSIS — I10 Essential (primary) hypertension: Secondary | ICD-10-CM | POA: Diagnosis not present

## 2022-06-13 DIAGNOSIS — I7121 Aneurysm of the ascending aorta, without rupture: Secondary | ICD-10-CM | POA: Diagnosis not present

## 2022-06-13 DIAGNOSIS — R600 Localized edema: Secondary | ICD-10-CM | POA: Diagnosis not present

## 2022-06-13 DIAGNOSIS — D6869 Other thrombophilia: Secondary | ICD-10-CM | POA: Diagnosis not present

## 2022-06-13 DIAGNOSIS — I480 Paroxysmal atrial fibrillation: Secondary | ICD-10-CM | POA: Diagnosis not present

## 2022-06-13 DIAGNOSIS — I739 Peripheral vascular disease, unspecified: Secondary | ICD-10-CM | POA: Diagnosis not present

## 2022-06-13 DIAGNOSIS — Z0000 Encounter for general adult medical examination without abnormal findings: Secondary | ICD-10-CM | POA: Diagnosis not present

## 2022-07-01 ENCOUNTER — Other Ambulatory Visit: Payer: Self-pay | Admitting: Cardiology

## 2022-07-01 DIAGNOSIS — I10 Essential (primary) hypertension: Secondary | ICD-10-CM

## 2022-07-01 DIAGNOSIS — I7121 Aneurysm of the ascending aorta, without rupture: Secondary | ICD-10-CM

## 2022-07-11 DIAGNOSIS — Z880 Allergy status to penicillin: Secondary | ICD-10-CM | POA: Diagnosis not present

## 2022-07-11 DIAGNOSIS — J441 Chronic obstructive pulmonary disease with (acute) exacerbation: Secondary | ICD-10-CM | POA: Diagnosis not present

## 2022-07-20 ENCOUNTER — Ambulatory Visit: Payer: Medicare Other | Admitting: Cardiology

## 2022-07-25 ENCOUNTER — Encounter: Payer: Self-pay | Admitting: Cardiology

## 2022-07-25 ENCOUNTER — Ambulatory Visit: Payer: Medicare Other | Admitting: Cardiology

## 2022-07-25 VITALS — BP 135/54 | HR 60 | Resp 15 | Ht 73.0 in | Wt 243.0 lb

## 2022-07-25 DIAGNOSIS — I7121 Aneurysm of the ascending aorta, without rupture: Secondary | ICD-10-CM

## 2022-07-25 DIAGNOSIS — I440 Atrioventricular block, first degree: Secondary | ICD-10-CM | POA: Diagnosis not present

## 2022-07-25 DIAGNOSIS — I739 Peripheral vascular disease, unspecified: Secondary | ICD-10-CM

## 2022-07-25 DIAGNOSIS — I480 Paroxysmal atrial fibrillation: Secondary | ICD-10-CM | POA: Diagnosis not present

## 2022-07-25 DIAGNOSIS — I10 Essential (primary) hypertension: Secondary | ICD-10-CM

## 2022-07-25 DIAGNOSIS — I4510 Unspecified right bundle-branch block: Secondary | ICD-10-CM | POA: Diagnosis not present

## 2022-07-25 DIAGNOSIS — I6523 Occlusion and stenosis of bilateral carotid arteries: Secondary | ICD-10-CM

## 2022-07-25 DIAGNOSIS — I444 Left anterior fascicular block: Secondary | ICD-10-CM | POA: Diagnosis not present

## 2022-07-25 NOTE — Progress Notes (Signed)
Primary Physician/Referring:  Deland Pretty, MD  Patient ID: Robert Hensley, male    DOB: Jul 04, 1941, 82 y.o.   MRN: [id number]  Chief Complaint  Patient presents with   PVD   Atrial Fibrillation   Follow-up   HPI:    Robert Hensley  is a 82 y.o. Caucasian male with paroxysmal atrial fibrillation S/P DCCV in 2019, asymptomatic bilateral carotid artery stenosis, hypertension, hyperlipidemia, COPD and prior tobacco use disorder, no significant coronary disease by angiography on 08/28/2017 moderate PAD with stable claudication.,  chronic PVCs and feels occasional palpitations.    No chest pain, dyspnea has remained stable.  He has mild varicose veins, is presently has not had any leg edema and he is also wearing tight socks.  Symptoms claudication remained stable.  Recently has had upper respiratory infection and bronchitis also was treated with antibiotics and steroids.  He is now feeling better.  Past Medical History:  Diagnosis Date   A-fib ([place])    Blood transfusion    Complication of anesthesia    Diverticulitis    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[place])    GI bleed    Hyperlipidemia    Hypertension    Pneumonia    Prostate cancer ([place])    Social History   Tobacco Use   Smoking status: Former    Packs/day: 1.00    Years: 25.00    Total pack years: 25.00    Types: Cigarettes    Quit date: 09/22/1974    Years since quitting: 47.8   Smokeless tobacco: Former    Types: Chew    Quit date: 07/24/1994  Substance Use Topics   Alcohol use: No   ROS  Review of Systems  Cardiovascular:  Positive for claudication (calf, right worse). Negative for chest pain, dyspnea on exertion, leg swelling and palpitations.  Gastrointestinal:  Negative for melena.   Objective  Blood pressure (!) 135/54, pulse 60, resp. rate 15, height _0  (1.854 m), weight 243 lb (110.2 kg), SpO2 96 %.     07/25/2022    9:24 AM 05/17/2022    1:56 PM 12/14/2021    8:52 AM  Vitals with BMI  Height _1  _2  _3    Weight 243 lbs 253 lbs 246 lbs  BMI 32.07 17.79 39.03  Systolic 009 233 007  Diastolic 54 57 61  Pulse 60 64 58     Physical Exam Constitutional:      Appearance: He is obese.  Neck:     Vascular: No JVD.  Cardiovascular:     Rate and Rhythm: Normal rate and regular rhythm.     Pulses:          Carotid pulses are 2+ on the right side with bruit and 2+ on the left side with bruit.      Femoral pulses are 2+ on the right side and 2+ on the left side.      Popliteal pulses are 2+ on the right side and 2+ on the left side.       Dorsalis pedis pulses are 1+ on the right side and 2+ on the left side.       Posterior tibial pulses are 0 on the right side and 0 on the left side.     Heart sounds: Murmur heard.     Early systolic murmur is present with a grade of 2/6 at the upper right sternal border.     No gallop.     Comments: Mild varicose  veins bilateral calves Pulmonary:     Effort: Pulmonary effort is normal.     Breath sounds: Normal breath sounds.  Abdominal:     General: Bowel sounds are normal.     Palpations: Abdomen is soft.  Musculoskeletal:     Right lower leg: No edema.     Left lower leg: No edema.    Laboratory examination:    Ref Range & Units 09/22/2019  TSH 0.450 - 4.500 uIU/mL 2.400    External labs:   Labs 06/08/2022:  Hb 13.5/HCT 39.1, platelets 242, normal indicis.  Sodium 137, potassium 4.5, BUN 14, creatinine 0.80, EGFR 82 mL, LFTs normal.  Total cholesterol 135, triglycerides 86, HDL 49, LDL 69.   Medications and allergies   Allergies  Allergen Reactions   Penicillins Anaphylaxis and Other (See Comments)    "throat swelling with synthetic penicillins" Has patient had a PCN reaction causing immediate rash, facial/tongue/throat swelling, SOB or lightheadedness with hypotension: No Has patient had a PCN reaction causing severe rash involving mucus membranes or skin necrosis: No Has patient had a PCN reaction that required hospitalization:  Yes Has patient had a PCN reaction occurring within the last 10 years: No If all of the above answers are "NO", then may proceed with Cephalosporin use.    Benzocaine Other (See Comments)    Hypotensive -Pt unaware of this allergy    Other Other (See Comments)    General Anesthesia - BP drops      Current Outpatient Medications:    albuterol (PROVENTIL) (2.5 MG/3ML) 0.083% nebulizer solution, Take 2.5 mg by nebulization every 6 (six) hours as needed for wheezing or shortness of breath., Disp: , Rfl:    amiodarone (PACERONE) 100 MG tablet, Take 100 mg by mouth daily., Disp: , Rfl:    amLODipine (NORVASC) 10 MG tablet, Take 10 mg by mouth daily., Disp: , Rfl:    atorvastatin (LIPITOR) 40 MG tablet, Take 40 mg by mouth daily., Disp: , Rfl:    B Complex Vitamins (B COMPLEX-B12 PO), Take 1 tablet by mouth daily., Disp: , Rfl:    diphenhydramine-acetaminophen (TYLENOL PM) 25-500 MG TABS tablet, Take 1 tablet by mouth at bedtime., Disp: , Rfl:    ELIQUIS 5 MG TABS tablet, TAKE 1 TABLET BY MOUTH TWICE  DAILY, Disp: 180 tablet, Rfl: 3   melatonin 5 MG TABS, Take 5 mg by mouth at bedtime., Disp: , Rfl:    metoprolol tartrate (LOPRESSOR) 25 MG tablet, Take 12.5 mg by mouth 2 (two) times daily. , Disp: , Rfl:    Multiple Vitamin (MULTIVITAMIN) tablet, Take 1 tablet by mouth daily., Disp: , Rfl:    Multiple Vitamins-Minerals (EQ VISION FORMULA 50+ PO), Take 1 capsule by mouth daily., Disp: , Rfl:    olmesartan-hydrochlorothiazide (BENICAR HCT) 40-25 MG tablet, TAKE 1 TABLET BY MOUTH IN THE  MORNING, Disp: 100 tablet, Rfl: 2   Omega-3 Fatty Acids (FISH OIL) 1200 MG CAPS, Take 1,200 mg by mouth daily. , Disp: , Rfl:    vitamin C (ASCORBIC ACID) 500 MG tablet, Take 500 mg by mouth daily. , Disp: , Rfl:      Radiology:   CT scan of the chest 05/17/2022 comparison 04/22/2021: 1. Dilated ascending thoracic aorta measuring 4.6 cm, unchanged when compared with prior exam. Ascending thoracic aortic  aneurysm. Recommend semi-annual imaging followup by CTA or MRA  2. Unchanged left-greater-than-right calcified pleural plaques, likely postsurgical. 3. Mild basilar subpleural reticular opacities with possible associated honeycomb change, findings are  similar to prior exam and can be seen in setting of interstitial lung abnormality. Correlate with patient's systems and consider pulmonary function tests for further evaluation. 4. Severe left main and three-vessel coronary artery calcifications. 5. Aortic Atherosclerosis (ICD10-I70.0) and Emphysema (ICD10-J43.9).  Cardiac Studies:   Coronary angiogram 08/28/2017: Very mild luminal irregularity, normal LVEF.  Lower Extremity Arterial Duplex 09/30/2021: Moderate velocity increase at the right mid superficial femoral artery suggest hemodynamically significant stenosis of >50%. There is moderate heterogeneous plaque noted.  No hemodynamically significant stenosis are identified in the left lower extremity arterial system. This exam reveals normal perfusion of the right lower extremity (ABI 1.12) and normal perfusion of the left lower extremity (ABI 1.24).  Carotid artery duplex 09/30/2021: Duplex suggests stenosis in the right internal carotid artery (50-69%). Duplex suggests stenosis in the right external carotid artery (<50%). Duplex suggests stenosis in the left internal carotid artery (50-69%). Duplex suggests stenosis in the left external carotid artery (<50%). Antegrade right vertebral artery flow. Antegrade left vertebral artery flow. No significant change compared to 03/09/2021. Follow up in six months is appropriate if clinically indicated.  Peripheral arteriogram 11/22/2021: Abdominal exam reveals presence of 2 renal arteries 1 on either sides, left renal artery also has a superior pole small renal artery.  Left renal main artery has mild calcific 20 to 30% stenosis.  Abdominal aorta shows mild diffuse calcific plaque.  Aortoiliac  bifurcation is widely patent.   Right iliac artery has mild disease, right external iliac artery shows moderate amount of tortuosity.  The right common femoral artery arises much more proximal than usual.  Right SFA in the Hunter's canal shows cauliflower-like multiple lesions but of no hemodynamic consequence, at most a 15 to 20 mmHg pressure gradient constituting a 40 to 50% stenosis.  There is three-vessel runoff below the right knee.  Left iliac artery has mild disease.  Left external iliac artery is again mildly tortuous.  Left femoral arteriogram distal runoff revealed mild disease in the left SFA much less significant than the right with again calcific spicules noted.  There is three-vessel runoff below the left knee.  Impression: Moderate PAD especially involving the right SFA, not a huge pressure gradient across the lesions, best treated medically, evaluation for pseudoclaudication indicated.      ABI 12/08/2021: This exam reveals normal perfusion of the right lower extremity (ABI 1.13).   This exam reveals normal perfusion of the left lower extremity (ABI 1.06). There is normal multiphasic waveform at the ankles.   No significant change in the ABI since 09/30/2021.  Please complete lower extremity arterial duplex was performed on 09/30/2021, had also suggested >50% stenosis of the right SFA.  PCV ECHOCARDIOGRAM COMPLETE 11/17/2021  Narrative Echocardiogram 11/17/2021: Normal LV systolic function with visual EF 55-60%. Left ventricle cavity is normal in size. Normal left ventricular wall thickness. Normal global wall motion. Normal diastolic filling pattern, normal LAP. Mild (Grade I) aortic regurgitation. Aortic valve sclerosis without stenosis. The aortic root and proximal ascending aorta slightly above normal limits (Sinus of Valsalva 84m and proximal ascending aorta 376m Compared to 08/08/2017 no significant change.     EKG:  EKG 07/25/2022: Sinus rhythm with first-degree  block at rate of 58 bpm, left axis deviation, left anterior fascicular block.  Incomplete right bundle branch block.  Poor R progression, cannot exclude anteroseptal infarct old.  Unknown low-voltage complexes.  Compared to 09/22/2021, no significant change.  Assessment     ICD-10-CM   1. Paroxysmal atrial fibrillation (HCC)  I48.0 EKG 12-Lead    2. Primary hypertension  I10     3. Asymptomatic bilateral carotid artery stenosis  I65.23     4. Aneurysm of ascending aorta without rupture (HCC)  I71.21     5. Claudication in peripheral vascular disease (HCC)  I73.9        No orders of the defined types were placed in this encounter.   Medications Discontinued During This Encounter  Medication Reason   ofloxacin (OCUFLOX) 0.3 % ophthalmic solution Completed Course   prednisoLONE acetate (PRED FORTE) 1 % ophthalmic suspension Completed Course     Recommendations:   Robert Hensley  is a 82 y.o. [place] male with paroxysmal atrial fibrillation S/P DCCV in 2019, asymptomatic bilateral carotid artery stenosis, hypertension, hyperlipidemia, COPD and prior tobacco use disorder, no significant coronary disease by angiography on 08/28/2017 moderate PAD with stable claudication.,  chronic PVCs and feels occasional palpitations.    No chest pain, dyspnea has remained stable.  He has mild varicose veins, is presently has not had any leg edema and he is also wearing tight socks.  Symptoms claudication remained stable.  1. Paroxysmal atrial fibrillation (HCC) He is presently maintaining sinus rhythm.  No bleeding diathesis.  No change in his EKG.  He is presently only on 100 mg of amiodarone.  2. Primary hypertension Blood pressure under excellent control, presently on ARB and also amlodipine, continue the same.  3. Asymptomatic bilateral carotid artery stenosis He has been scheduled for carotid artery duplex in 4 to 5 months from now, I would like to see him back in 6 months for follow-up of PAD  and also PAF and asymptomatic carotid stenosis.  I reviewed his external labs, lipids under excellent control, risk factors are well-controlled.  4. Aneurysm of ascending aorta without rupture (HCC) Reviewed the results of the CT scan of the chest, 4.5 cm ascending aortic aneurysm has remained stable.  5. Claudication in peripheral vascular disease ([place]) Claudication symptoms especially involving the right leg has remained stable, I reviewed the angiograms again with the patient.  If she continues to have lifestyle-limiting claudication can consider angioplasty.    Adrian Prows, MD, [facility] 07/25/2022, 9:39 AM Office: [phone] Pager: [phone]

## 2022-07-25 NOTE — Progress Notes (Signed)
EKG 07/25/2022: Sinus rhythm with first-degree block at rate of 58 bpm, left axis deviation, left anterior fascicular block.  Incomplete right bundle branch block.  Poor R progression, cannot exclude anteroseptal infarct old.  Unknown low-voltage complexes.  Compared to 09/22/2021, no significant change.

## 2022-09-11 DIAGNOSIS — L570 Actinic keratosis: Secondary | ICD-10-CM | POA: Diagnosis not present

## 2022-10-02 ENCOUNTER — Other Ambulatory Visit: Payer: Self-pay | Admitting: Surgery

## 2022-10-02 DIAGNOSIS — I712 Thoracic aortic aneurysm, without rupture, unspecified: Secondary | ICD-10-CM

## 2022-10-16 ENCOUNTER — Other Ambulatory Visit: Payer: Self-pay | Admitting: Cardiology

## 2022-10-18 ENCOUNTER — Telehealth: Payer: Self-pay

## 2022-10-18 DIAGNOSIS — I480 Paroxysmal atrial fibrillation: Secondary | ICD-10-CM

## 2022-10-18 MED ORDER — AMIODARONE HCL 100 MG PO TABS: 100.0000 mg | ORAL_TABLET | Freq: Every day | ORAL | 3 refills | Status: DC

## 2022-10-18 NOTE — Telephone Encounter (Signed)
ICD-10-CM   1. Paroxysmal atrial fibrillation (HCC)  I48.0 amiodarone (PACERONE) 100 MG tablet

## 2022-10-18 NOTE — Telephone Encounter (Signed)
Patient needs refill for amiodarone 100 mg sent to [facility] on [address]. , [place].

## 2022-10-19 ENCOUNTER — Other Ambulatory Visit: Payer: Self-pay | Admitting: Cardiology

## 2022-10-19 ENCOUNTER — Other Ambulatory Visit: Payer: Self-pay

## 2022-10-19 MED ORDER — AMIODARONE HCL 200 MG PO TABS: 100.0000 mg | ORAL_TABLET | Freq: Every day | ORAL | 0 refills | Status: DC

## 2022-10-20 ENCOUNTER — Other Ambulatory Visit: Payer: Self-pay | Admitting: Cardiology

## 2022-10-31 NOTE — Progress Notes (Signed)
[address]       [place] [address]             [phone]   PCP is Merri Brunette, MD Referring Provider is Merri Brunette, MD  Chief Complaint: Ascending thoracic aortic aneurysm   HPI: This is an 83 year old male with a past medical history of atrial fibrillation, hypertension, hyperlipidemia, GI bleed, emphysematous COPD, remote tobacco abuse, prostate cancer who was incidentally found to have a 4.6 cm ATAA. He was last seen in October 2023 and the ATAA was stable at that time. He presents today for further surveillance of his ATAA. Patient denies chest pain, pressure, or tightness. He denies LE edema.   Past Medical History:  Diagnosis Date   A-fib (HCC)    Blood transfusion    Complication of anesthesia    Diverticulitis    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HCC)    GI bleed    Hyperlipidemia    Hypertension    Pneumonia    Prostate cancer [facility])     Past Surgical History:  Procedure Laterality Date   ABDOMINAL AORTOGRAM W/LOWER EXTREMITY Bilateral 11/22/2021   Procedure: ABDOMINAL AORTOGRAM W/LOWER EXTREMITY;  Surgeon: Yates Decamp, MD;  Location: [facility] INVASIVE CV LAB;  Service: Cardiovascular;  Laterality: Bilateral;   CARDIOVERSION N/A 09/11/2017   Procedure: CARDIOVERSION;  Surgeon: Elder Negus, MD;  Location: [facility] ENDOSCOPY;  Service: Cardiovascular;  Laterality: N/A;   CATARACT EXTRACTION     cyst removed from back     cyst removed from right knee     LEFT HEART CATH AND CORONARY ANGIOGRAPHY N/A 08/28/2017   Procedure: LEFT HEART CATH AND CORONARY ANGIOGRAPHY;  Surgeon: Elder Negus, MD;  Location: [facility] INVASIVE CV LAB;  Service: Cardiovascular;  Laterality: N/A;   PROSTATECTOMY     TESTICLE REMOVAL     Right   TUMOR REMOVAL  07/24/1974   Between lung and chest wall    Family History  Problem Relation Age of Onset   Liver cancer Sister    Cancer Brother    Heart failure Maternal Aunt    Hypertension Maternal Aunt     Social  History Social History   Tobacco Use   Smoking status: Former    Packs/day: 1.00    Years: 25.00    Additional pack years: 0.00    Total pack years: 25.00    Types: Cigarettes    Quit date: 09/22/1974    Years since quitting: 48.1   Smokeless tobacco: Former    Types: Chew    Quit date: 07/24/1994  Vaping Use   Vaping Use: Never used  Substance Use Topics   Alcohol use: No   Drug use: No    Current Outpatient Medications  Medication Sig Dispense Refill   albuterol (PROVENTIL) (2.5 MG/3ML) 0.083% nebulizer solution Take 2.5 mg by nebulization every 6 (six) hours as needed for wheezing or shortness of breath.     amiodarone (PACERONE) 200 MG tablet Take 0.5 tablets (100 mg total) by mouth daily. 7 tablet 0   amLODipine (NORVASC) 10 MG tablet Take 10 mg by mouth daily.     atorvastatin (LIPITOR) 40 MG tablet Take 40 mg by mouth daily.     B Complex Vitamins (B COMPLEX-B12 PO) Take 1 tablet by mouth daily.     diphenhydramine-acetaminophen (TYLENOL PM) 25-500 MG TABS tablet Take 1 tablet by mouth at bedtime.     ELIQUIS 5 MG TABS tablet TAKE 1 TABLET BY  MOUTH TWICE  DAILY 200 tablet 2   melatonin 5 MG TABS Take 5 mg by mouth at bedtime.     metoprolol tartrate (LOPRESSOR) 25 MG tablet Take 12.5 mg by mouth 2 (two) times daily.      Multiple Vitamin (MULTIVITAMIN) tablet Take 1 tablet by mouth daily.     Multiple Vitamins-Minerals (EQ VISION FORMULA 50+ PO) Take 1 capsule by mouth daily.     olmesartan-hydrochlorothiazide (BENICAR HCT) 40-25 MG tablet TAKE 1 TABLET BY MOUTH IN THE  MORNING 100 tablet 2   Omega-3 Fatty Acids (FISH OIL) 1200 MG CAPS Take 1,200 mg by mouth daily.      vitamin C (ASCORBIC ACID) 500 MG tablet Take 500 mg by mouth daily.       Allergies  Allergen Reactions   Penicillins Anaphylaxis and Other (See Comments)    "throat swelling with synthetic penicillins" Has patient had a PCN reaction causing immediate rash, facial/tongue/throat swelling, SOB or  lightheadedness with hypotension: No Has patient had a PCN reaction causing severe rash involving mucus membranes or skin necrosis: No Has patient had a PCN reaction that required hospitalization: Yes Has patient had a PCN reaction occurring within the last 10 years: No If all of the above answers are "NO", then may proceed with Cephalosporin use.    Benzocaine Other (See Comments)    Hypotensive -Pt unaware of this allergy    Other Other (See Comments)    General Anesthesia - BP drops    Family History: Father deceased- Aspiration, PAD, blood poisoning Mother decreased with Alzheimer's  Review of Systems  Chest Pain [ N ] Resting SOB Klaus.Mock[N ] Exertional SOB [ N ]  Pedal Edema Klaus.Mock[N  ] Syncope Klaus.Mock[N  ]  General Review of Systems: [Y] = yes [ N]=no  Consitutional:   nausea [ N];  fever [ N];  Eye : cataracts[Y ]; Amaurosis fugax[ N ];  Resp: cough [ ] ;  wheezing [Y]-seasonal allergies, hemoptysis[N ];  GI: vomiting[ N]; melena[ N]; hematochezia [N];  NW:GNFAOZHYQ[MGU:hematuria[N ]; Heme/Lymph: anemia[ N];  Neuro: TIA[N ];stroke[N ];  seizures[ N];  Endocrine: diabetes[ N];   Vital Signs: Vitals:   11/08/22 1258  BP: (!) 170/67  Pulse: 72  Resp: 20  SpO2: 99%  Repeat BP 159/68 after discussion and examination    Physical Exam: CV-RRR, grad II/VI systolic murmur Neck-No carotid bruit Pulmonary-Expiratory wheezing Abdomen-Soft, non tender, bowel sounds  Extremities-No LE edema Neurologic-Grossly intact without focal deficit   Diagnostic Tests:  Narrative & Impression  CLINICAL DATA:  Aortic aneurysm and lung nodule follow-up   EXAM: CT CHEST WITHOUT CONTRAST   TECHNIQUE: Multidetector CT imaging of the chest was performed following the standard protocol without IV contrast.   RADIATION DOSE REDUCTION: This exam was performed according to the departmental dose-optimization program which includes automated exposure control, adjustment of the mA and/or kV according to patient size  and/or use of iterative reconstruction technique.   COMPARISON:  CT chest dated September 30th 2022   FINDINGS: Cardiovascular: Heart size. Pericardial effusion. Severe left main and three-vessel coronary artery calcifications. Dilated ascending thoracic aorta measuring 4.6 by 4.4 cm unchanged when compared with prior exam. Moderate calcified plaque of the thoracic aorta.   Mediastinum/Nodes: Small hiatal hernia. Thyroid is unremarkable. No pathologically enlarged lymph nodes seen in the chest.   Lungs/Pleura: Central airways are patent. Mild paraseptal emphysema. Left-greater-than-right calcified pleural plaques. Mild basilar subpleural reticular opacities with associated honeycomb change. Unchanged round atelectasis of the left  lower lobe. No consolidation, pleural effusion or pneumothorax. Small bilateral solid pulmonary nodules, largest measures 4 mm and is located in the right lower lobe on image 128, stable in size when compared with the prior exam, no further follow-up imaging needed per Fleischner guidelines given greater than 1 year stability.   Upper Abdomen: No acute abnormality.   Musculoskeletal: No chest wall mass or suspicious bone lesions identified.   IMPRESSION: 1. Dilated ascending thoracic aorta measuring 4.6 cm, unchanged when compared with prior exam. Ascending thoracic aortic aneurysm. Recommend semi-annual imaging followup by CTA or MRA and referral to cardiothoracic surgery if not already obtained. This recommendation follows 2010 ACCF/AHA/AATS/ACR/ASA/SCA/SCAI/SIR/STS/SVM Guidelines for the Diagnosis and Management of Patients With Thoracic Aortic Disease. Circulation. 2010; 121: Y650-P546. Aortic aneurysm NOS (ICD10-I71.9) 2. Unchanged left-greater-than-right calcified pleural plaques, likely postsurgical. 3. Mild basilar subpleural reticular opacities with possible associated honeycomb change, findings are similar to prior exam and can be seen in  setting of interstitial lung abnormality. Correlate with patient's systems and consider pulmonary function tests for further evaluation. 4. Severe left main and three-vessel coronary artery calcifications. 5. Aortic Atherosclerosis (ICD10-I70.0) and Emphysema (ICD10-J43.9).     Electronically Signed   By: Allegra Lai M.D.   On: 05/17/2022 14:16   Impression and Plan: CT of the chest with a stable 4.6  cm ascending aortic aneurysm.  Echocardiogram done in April 2023 showed evidence of mild aortic regurgitation.  We discussed the natural history and and risk factors for growth of ascending aortic aneurysms.  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 He states he gets very low blood pressure with anesthesia and if he would get an aortic dissection, he would likely elect not to have surgery. We will continue surveillance and a repeat CT chest was ordered for 6 months.     Ardelle Balls, PA-C [facility] [phone]

## 2022-11-06 ENCOUNTER — Other Ambulatory Visit: Payer: Medicare Other

## 2022-11-08 ENCOUNTER — Ambulatory Visit
Admission: RE | Admit: 2022-11-08 | Discharge: 2022-11-08 | Disposition: A | Discharge status: Home / Self Care | Payer: Medicare Other | Source: Ambulatory Visit | Attending: Surgery | Admitting: Surgery

## 2022-11-08 ENCOUNTER — Ambulatory Visit: Payer: Medicare Other | Admitting: Physician Assistant

## 2022-11-08 ENCOUNTER — Encounter: Payer: Self-pay | Admitting: Physician Assistant

## 2022-11-08 VITALS — BP 170/67 | HR 72 | Resp 20 | Ht 73.0 in | Wt 256.0 lb

## 2022-11-08 DIAGNOSIS — I7121 Aneurysm of the ascending aorta, without rupture: Secondary | ICD-10-CM | POA: Diagnosis not present

## 2022-11-08 DIAGNOSIS — I2510 Atherosclerotic heart disease of native coronary artery without angina pectoris: Secondary | ICD-10-CM | POA: Diagnosis not present

## 2022-11-08 DIAGNOSIS — J439 Emphysema, unspecified: Secondary | ICD-10-CM | POA: Diagnosis not present

## 2022-11-08 DIAGNOSIS — I712 Thoracic aortic aneurysm, without rupture, unspecified: Secondary | ICD-10-CM

## 2022-11-08 DIAGNOSIS — J929 Pleural plaque without asbestos: Secondary | ICD-10-CM | POA: Diagnosis not present

## 2022-11-08 MED ORDER — IOPAMIDOL (ISOVUE-370) INJECTION 76%
75.0000 mL | Freq: Once | INTRAVENOUS | Status: AC | PRN
  Administered 2022-11-08: 75 mL via INTRAVENOUS

## 2022-11-08 NOTE — Patient Instructions (Addendum)
Risk Modification in those with ascending thoracic aortic aneurysm:  Continue good control of blood pressure (prefer SBP 130/80 or less)-continue Lopressor, Benicar/HCTZ, Amlodipine  2. Avoid fluoroquinolone antibiotics (I.e Ciprofloxacin, Avelox, Levofloxacin, Ofloxacin)  3.  Use of statin (to decrease cardiovascular risk)-continue Atorvastatin  4.  Exercise and activity limitations is individualized, but in general, contact sports are to be  avoided and one should avoid heavy lifting (defined as half of ideal body weight) and exercises involving sustained Valsalva maneuver.  5. Counseling for those suspected of having genetically mediated disease. First-degree relatives of those with TAA disease should be screened as well as those who have a connective tissue disease (I.e with Marfan syndrome, Ehlers-Danlos syndrome, and Loeys-Dietz syndrome) or a  bicuspid aortic valve,have an increased risk for complications related to TAA. Per patient, brother has an ATAA.  6. He has a remote history of tobacco abuse-quit 1996

## 2022-11-10 ENCOUNTER — Other Ambulatory Visit: Payer: Medicare Other

## 2022-12-27 ENCOUNTER — Ambulatory Visit: Payer: Medicare Other

## 2022-12-27 DIAGNOSIS — I6523 Occlusion and stenosis of bilateral carotid arteries: Secondary | ICD-10-CM | POA: Diagnosis not present

## 2023-01-03 ENCOUNTER — Encounter: Payer: Self-pay | Admitting: Cardiology

## 2023-01-03 ENCOUNTER — Ambulatory Visit: Payer: Medicare Other | Admitting: Cardiology

## 2023-01-03 VITALS — BP 126/53 | HR 59 | Resp 16 | Ht 73.0 in | Wt 248.6 lb

## 2023-01-03 DIAGNOSIS — I480 Paroxysmal atrial fibrillation: Secondary | ICD-10-CM | POA: Diagnosis not present

## 2023-01-03 DIAGNOSIS — I7121 Aneurysm of the ascending aorta, without rupture: Secondary | ICD-10-CM

## 2023-01-03 DIAGNOSIS — I739 Peripheral vascular disease, unspecified: Secondary | ICD-10-CM

## 2023-01-03 DIAGNOSIS — I10 Essential (primary) hypertension: Secondary | ICD-10-CM

## 2023-01-03 DIAGNOSIS — I6523 Occlusion and stenosis of bilateral carotid arteries: Secondary | ICD-10-CM

## 2023-01-03 MED ORDER — AMIODARONE HCL 200 MG PO TABS
100.0000 mg | ORAL_TABLET | ORAL | 2 refills | Status: DC
Start: 2023-01-03 — End: 2023-04-17

## 2023-01-03 NOTE — Progress Notes (Signed)
Primary Physician/Referring:  Merri Brunette, MD  Patient ID: Robert Hensley, male    DOB: 1940-12-15, 82 y.o.   MRN: [id number]  Chief Complaint  Patient presents with   Atrial Fibrillation   Follow-up   Claudication in peripheral vascular disease   HPI:    Robert Hensley  is a 82 y.o. Caucasian male with paroxysmal atrial fibrillation S/P DCCV in 2019, asymptomatic bilateral carotid artery stenosis, hypertension, hyperlipidemia, COPD and prior tobacco use disorder, ascending aortic aneurysm measuring 4.6 cm, no significant coronary disease by angiography on 08/28/2017 moderate PAD with stable claudication.,  chronic PVCs and feels occasional palpitations.    No chest pain, dyspnea has remained stable.  He has mild varicose veins, is presently has not had any leg edema and he is also wearing tight socks.  Symptoms claudication remained stable.   Past Medical History:  Diagnosis Date   A-fib (HCC)    Blood transfusion    Complication of anesthesia    Diverticulitis    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HCC)    GI bleed    Hyperlipidemia    Hypertension    Pneumonia    Prostate cancer (HCC)    Social History   Tobacco Use   Smoking status: Former    Packs/day: 1.00    Years: 25.00    Additional pack years: 0.00    Total pack years: 25.00    Types: Cigarettes    Quit date: 09/22/1974    Years since quitting: 48.3   Smokeless tobacco: Former    Types: Chew    Quit date: 07/24/1994  Substance Use Topics   Alcohol use: No   ROS  Review of Systems  Cardiovascular:  Positive for claudication (calf, right worse). Negative for chest pain, dyspnea on exertion, leg swelling and palpitations.  Gastrointestinal:  Negative for melena.   Objective  Blood pressure (!) 126/53, pulse (!) 59, resp. rate 16, height 6\' 1"  (1.854 m), weight 248 lb 9.6 oz (112.8 kg), SpO2 97 %.     01/03/2023    9:25 AM 11/08/2022   12:58 PM 07/25/2022    9:24 AM  Vitals with BMI  Height 6\' 1"  6\' 1"  6\' 1"   Weight 248 lbs  10 oz 256 lbs 243 lbs  BMI 32.81 33.78 32.07  Systolic 126 170 409  Diastolic 53 67 54  Pulse 59 72 60     Physical Exam Constitutional:      Appearance: He is obese.  Neck:     Vascular: No JVD.  Cardiovascular:     Rate and Rhythm: Normal rate and regular rhythm.     Pulses:          Carotid pulses are 2+ on the right side with bruit and 2+ on the left side with bruit.      Femoral pulses are 2+ on the right side and 2+ on the left side.      Popliteal pulses are 2+ on the right side and 2+ on the left side.       Dorsalis pedis pulses are 1+ on the right side and 2+ on the left side.       Posterior tibial pulses are 0 on the right side and 0 on the left side.     Heart sounds: Murmur heard.     Early systolic murmur is present with a grade of 2/6 at the upper right sternal border.     No gallop.     Comments: Mild varicose  veins bilateral calves Pulmonary:     Effort: Pulmonary effort is normal.     Breath sounds: Normal breath sounds.  Abdominal:     General: Bowel sounds are normal.     Palpations: Abdomen is soft.  Musculoskeletal:     Right lower leg: No edema.     Left lower leg: No edema.    Laboratory examination:    Ref Range & Units 09/22/2019  TSH 0.450 - 4.500 uIU/mL 2.400    External labs:   Labs 06/08/2022:  Hb 13.5/HCT 39.1, platelet count 242.  Normal indicis.  Sodium 137, potassium 4.5, BUN 14, creatinine 0.80, EGFR 80 to mL, LFTs normal.  Total cholesterol 135, triglycerides 86, HDL 49, LDL 69.  Non-HDL cholesterol 86.   Radiology:   CT angio chest 11/08/2022: Stable aneurysmal dilatation of the ascending aorta to 4.6 cm compared to 04/22/2021 and 05/17/2022 Recommend semi-annual imaging followup by CTA or MRA 2. Mild basilar subpleural reticular opacities with possible associated honeycomb change, findings are similar to prior exam and can be seen in setting of interstitial lung abnormality. Correlate with patient's systems and consider  pulmonary function tests for further evaluation. 3. Severe left main and three-vessel coronary artery calcifications. 4. Aortic Atherosclerosis (ICD10-I70.0) and Emphysema (ICD10-J43.9).  Cardiac Studies:   Coronary angiogram 08/28/2017: Very mild luminal irregularity, normal LVEF.  PCV ECHOCARDIOGRAM COMPLETE 11/17/2021  Narrative Echocardiogram 11/17/2021: Normal LV systolic function with visual EF 55-60%. Left ventricle cavity is normal in size. Normal left ventricular wall thickness. Normal global wall motion. Normal diastolic filling pattern, normal LAP. Mild (Grade I) aortic regurgitation. Aortic valve sclerosis without stenosis. The aortic root and proximal ascending aorta slightly above normal limits (Sinus of Valsalva 38mm and proximal ascending aorta 38mm) Compared to 08/08/2017 no significant change.  Lower Extremity Arterial Duplex 09/30/2021: Moderate velocity increase at the right mid superficial femoral artery suggest hemodynamically significant stenosis of >50%. There is moderate heterogeneous plaque noted.  No hemodynamically significant stenosis are identified in the left lower extremity arterial system. This exam reveals normal perfusion of the right lower extremity (ABI 1.12) and normal perfusion of the left lower extremity (ABI 1.24).  Peripheral arteriogram 11/22/2021: Abdominal exam reveals presence of 2 renal arteries 1 on either sides, left renal artery also has a superior pole small renal artery.  Left renal main artery has mild calcific 20 to 30% stenosis.  Abdominal aorta shows mild diffuse calcific plaque.  Aortoiliac bifurcation is widely patent.   Right iliac artery has mild disease, right external iliac artery shows moderate amount of tortuosity.  The right common femoral artery arises much more proximal than usual.  Right SFA in the Hunter's canal shows cauliflower-like multiple lesions but of no hemodynamic consequence, at most a 15 to 20 mmHg pressure  gradient constituting a 40 to 50% stenosis.  There is three-vessel runoff below the right knee.  Left iliac artery has mild disease.  Left external iliac artery is again mildly tortuous.  Left femoral arteriogram distal runoff revealed mild disease in the left SFA much less significant than the right with again calcific spicules noted.  There is three-vessel runoff below the left knee.  Impression: Moderate PAD especially involving the right SFA, not a huge pressure gradient across the lesions, best treated medically, evaluation for pseudoclaudication indicated.      ABI 12/08/2021: This exam reveals normal perfusion of the right lower extremity (ABI 1.13).   This exam reveals normal perfusion of the left lower extremity (ABI 1.06). There  is normal multiphasic waveform at the ankles.   No significant change in the ABI since 09/30/2021.  Please complete lower extremity arterial duplex was performed on 09/30/2021, had also suggested >50% stenosis of the right SFA.  Carotid artery duplex 12/27/2022: Duplex suggests stenosis in the right internal carotid artery (minimal).  <50% stenosis in the right external carotid artery. Duplex suggests stenosis in the left internal carotid artery (50-69%).   <50% stenosis in the left external carotid artery. Antegrade right vertebral artery flow. Antegrade left vertebral artery flow. Compared to the study done on 09/30/2021, right ICA stenosis of 50 to 69% not noted in the present study,  images are of better quality and appear to be accurate. Follow up in six months is appropriate if clinically indicated.  EKG:  EKG 01/11/2023: Sinus rhythm with first-degree AV block at rate of 56 bpm, left intrafascicular block.  Poor R progression, cannot exclude anteroseptal infarct old.  Low-voltage.  Pulmonary disease pattern.  Single PVC.  Compared to 07/25/2022, no significant change.  PVC  Medications and allergies   Allergies  Allergen Reactions   Penicillins  Anaphylaxis and Other (See Comments)    "throat swelling with synthetic penicillins" Has patient had a PCN reaction causing immediate rash, facial/tongue/throat swelling, SOB or lightheadedness with hypotension: No Has patient had a PCN reaction causing severe rash involving mucus membranes or skin necrosis: No Has patient had a PCN reaction that required hospitalization: Yes Has patient had a PCN reaction occurring within the last 10 years: No If all of the above answers are "NO", then may proceed with Cephalosporin use.    Benzocaine Other (See Comments)    Hypotensive -Pt unaware of this allergy    Other Other (See Comments)    General Anesthesia - BP drops     Current Outpatient Medications:    amLODipine (NORVASC) 10 MG tablet, Take 10 mg by mouth daily., Disp: , Rfl:    atorvastatin (LIPITOR) 40 MG tablet, Take 40 mg by mouth daily., Disp: , Rfl:    B Complex Vitamins (B COMPLEX-B12 PO), Take 1 tablet by mouth daily., Disp: , Rfl:    diphenhydramine-acetaminophen (TYLENOL PM) 25-500 MG TABS tablet, Take 1 tablet by mouth at bedtime., Disp: , Rfl:    ELIQUIS 5 MG TABS tablet, TAKE 1 TABLET BY MOUTH TWICE  DAILY, Disp: 200 tablet, Rfl: 2   Melatonin 10 MG TABS, Take 10 mg by mouth at bedtime., Disp: , Rfl:    metoprolol tartrate (LOPRESSOR) 25 MG tablet, Take 12.5 mg by mouth 2 (two) times daily. , Disp: , Rfl:    Multiple Vitamin (MULTIVITAMIN) tablet, Take 1 tablet by mouth daily., Disp: , Rfl:    Multiple Vitamins-Minerals (EQ VISION FORMULA 50+ PO), Take 1 capsule by mouth daily., Disp: , Rfl:    olmesartan-hydrochlorothiazide (BENICAR HCT) 40-25 MG tablet, TAKE 1 TABLET BY MOUTH IN THE  MORNING, Disp: 100 tablet, Rfl: 2   vitamin C (ASCORBIC ACID) 500 MG tablet, Take 500 mg by mouth daily. , Disp: , Rfl:    amiodarone (PACERONE) 200 MG tablet, Take 0.5 tablets (100 mg total) by mouth every other day., Disp: 30 tablet, Rfl: 2   Assessment     ICD-10-CM   1. Paroxysmal atrial  fibrillation (HCC)  I48.0 EKG 12-Lead    amiodarone (PACERONE) 200 MG tablet    2. Primary hypertension  I10     3. Asymptomatic bilateral carotid artery stenosis  I65.23 PCV CAROTID DUPLEX (BILATERAL)  4. Aneurysm of ascending aorta without rupture (HCC)  I71.21 CT ANGIO CHEST AORTA W/CM & OR WO/CM    5. Claudication in peripheral vascular disease (HCC)  I73.9       Meds ordered this encounter  Medications   amiodarone (PACERONE) 200 MG tablet    Sig: Take 0.5 tablets (100 mg total) by mouth every other day.    Dispense:  30 tablet    Refill:  2    Waiting for mail order    Medications Discontinued During This Encounter  Medication Reason   albuterol (PROVENTIL) (2.5 MG/3ML) 0.083% nebulizer solution    amiodarone (PACERONE) 200 MG tablet Reorder   Omega-3 Fatty Acids (FISH OIL) 1200 MG CAPS Discontinued by provider   Recommendations:   Robert Hensley  is a 82 y.o. Caucasian male with paroxysmal atrial fibrillation S/P DCCV in 2019, asymptomatic bilateral carotid artery stenosis, hypertension, hyperlipidemia, COPD and prior tobacco use disorder, ascending aortic aneurysm measuring 4.6 cm, no significant coronary disease by angiography on 08/28/2017 moderate PAD with stable claudication.,  chronic PVCs and feels occasional palpitations.    No chest pain, dyspnea has remained stable.  He has mild varicose veins, is presently has not had any leg edema and he is also wearing tight socks.  Symptoms claudication remained stable.  1. Paroxysmal atrial fibrillation (HCC) Patient is maintaining sinus rhythm, as he is maintaining sinus rhythm, is also markedly bradycardic, presently 82 years of age although asymptomatic and has bifascicular block, will reduce the dose of the amiodarone to 100 mg every other day.  Advised him to discontinue fish oil capsules to reduce the risk of bleeding. - EKG 12-Lead - amiodarone (PACERONE) 200 MG tablet; Take 0.5 tablets (100 mg total) by mouth every  other day.  Dispense: 30 tablet; Refill: 2  2. Primary hypertension Blood pressure under excellent control, current medications are appropriate.  3. Asymptomatic bilateral carotid artery stenosis Patient's carotid artery duplex reviewed, he has very minimal plaque on the right and moderate disease on the left, continue 6 monthly surveillance.  4. Aneurysm of ascending aorta without rupture (HCC) I reviewed the results of the CT scan of the chest, he is ascending aortic aneurysm has remained stable over the past 3 to 4 years.  I do not think he needs 6 monthly surveillance CT scan, annual CT scan is appropriate.  I will take over his surveillance, I will place orders for his CT scan to be done next year.  5. Claudication in peripheral vascular disease (HCC) He does have small vessel disease, absent pedal pulses however remains asymptomatic with minimal symptoms of claudication.  Continue present medical management.   This was a 40-minute office visit encounter in review of his external labs, review of external CT scan reports and discussions regarding aortic aneurysm management and atrial fibrillation management.  I will see him back in 6 months for follow-up.   Robert Decamp, MD, [facility] 01/03/2023, 9:51 AM Office: [phone] Pager: [phone]

## 2023-01-27 IMAGING — DX DG CHEST 1V PORT
1 series · 2 of 2 positions shown · non-contrast
Comparison: 02/10/2021, chest CT 09/18/2011

CLINICAL DATA: SOB ,Covid +

EXAM:
PORTABLE CHEST 1 VIEW

[Series 1: chest · 0.14mm/px · 2 of 2 slices shown]
[im 1/2]
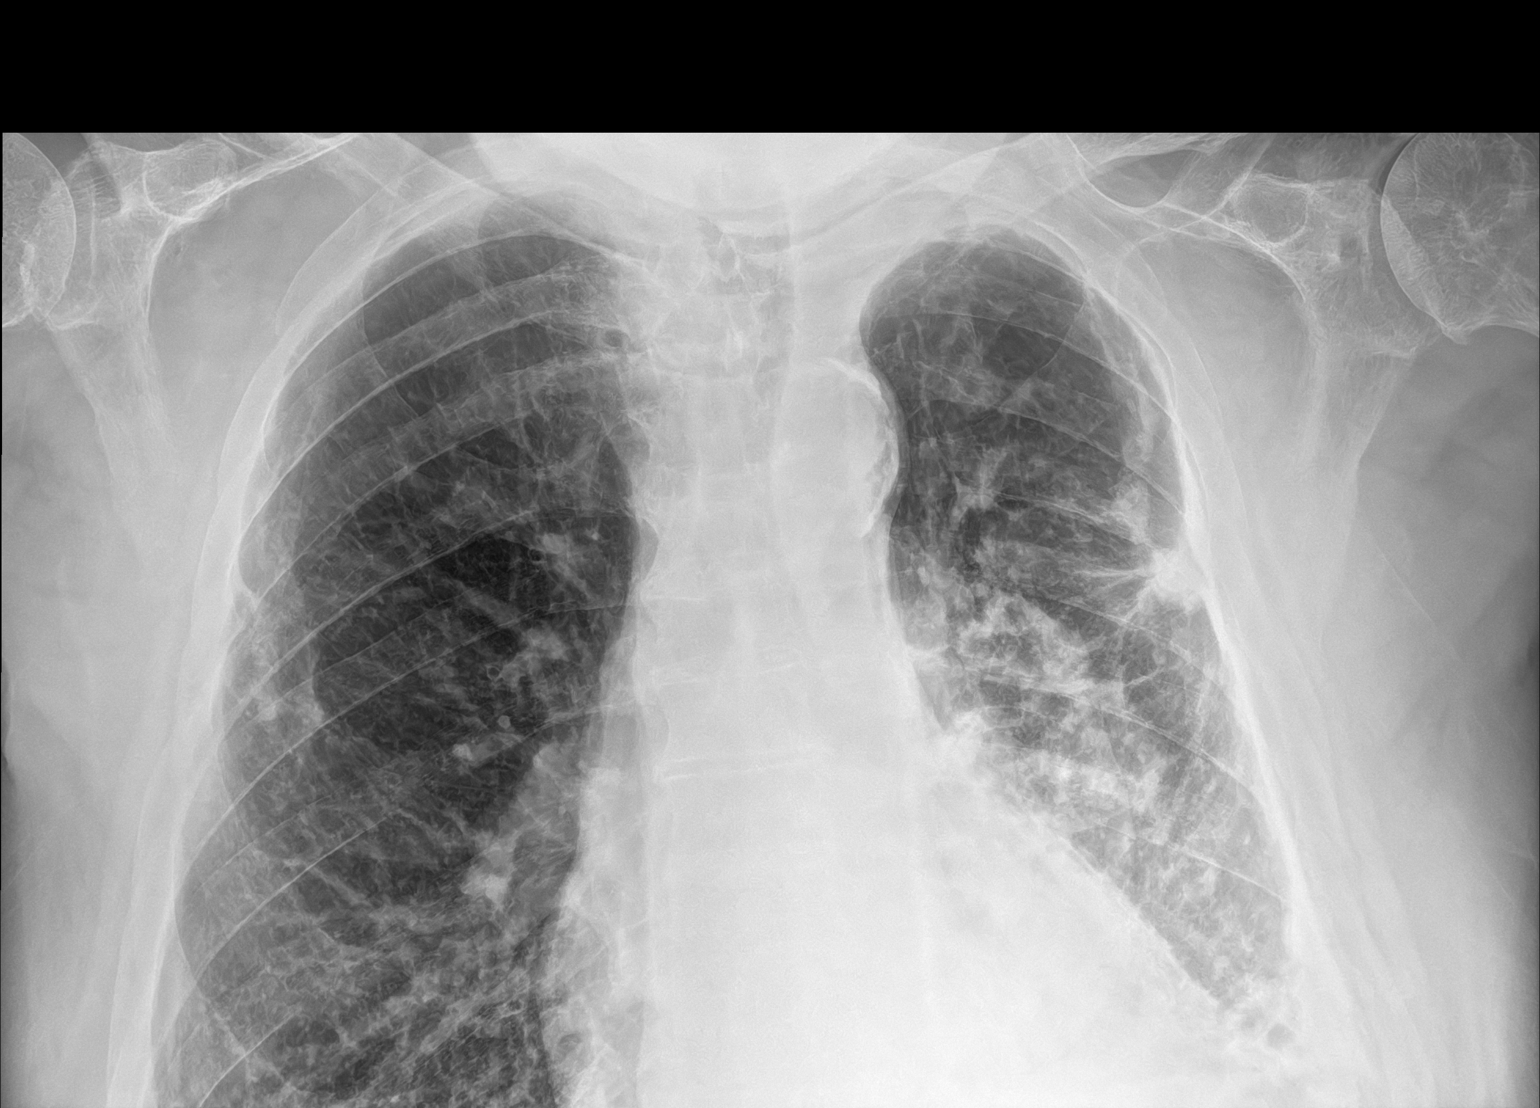
[im 2/2]
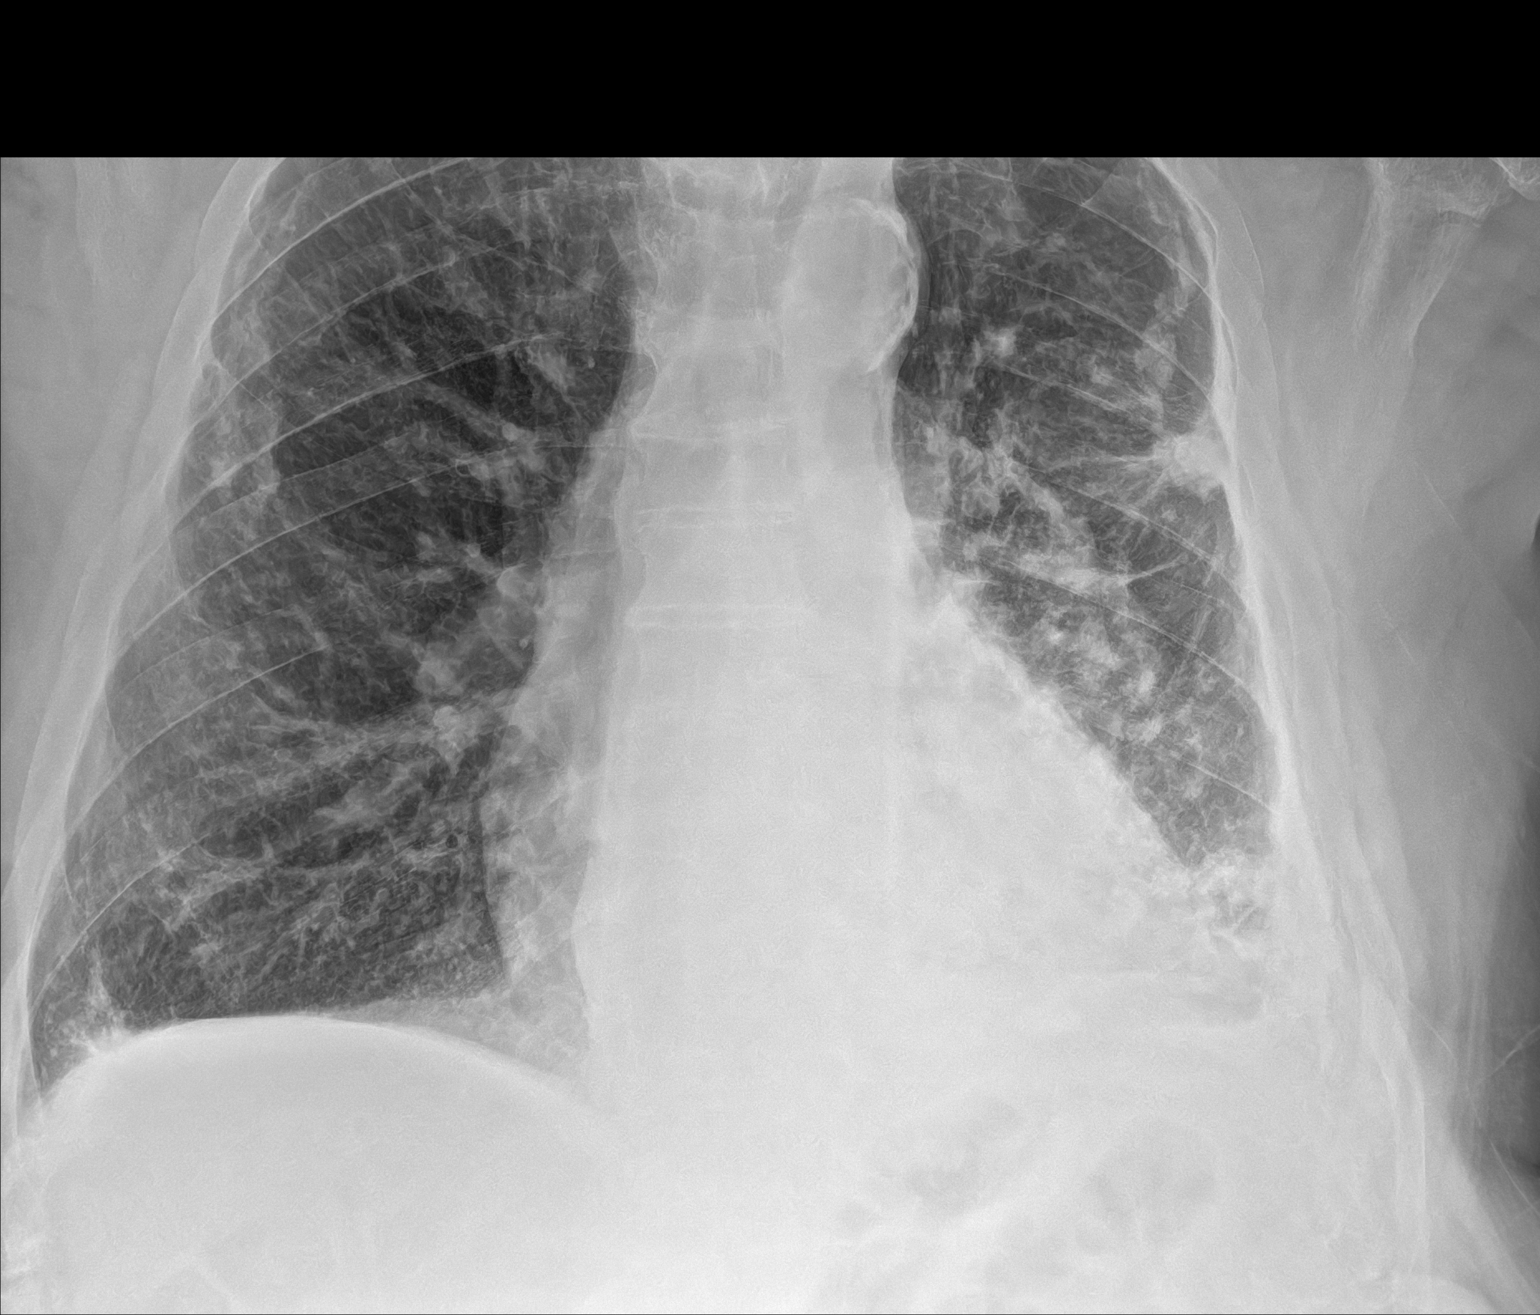

[2 of 2 positions shown; findings below may reference images not displayed]

FINDINGS: Unchanged, enlarged cardiac silhouette. Aortic calcifications.
Unchanged bilateral scarring in volume loss. There is bilateral
pleural thickening, left greater than right with calcified pleural
plaques. Increased size of a nodular opacity with adjacent
reticulation in the peripheral left upper lung. Increased left
basilar pleural thickening or pleural effusion. No visible
pneumothorax. No acute osseous abnormality.
IMPRESSION: Chronic pleural thickening and calcified pleural plaques, left
greater than right, increased in the left lung base.

Increased size of a nodular opacity with adjacent reticulation in
the peripheral left upper lung. Recommend noncontrast CT for further
evaluation.

## 2023-02-21 DIAGNOSIS — H40033 Anatomical narrow angle, bilateral: Secondary | ICD-10-CM | POA: Diagnosis not present

## 2023-02-21 DIAGNOSIS — H43393 Other vitreous opacities, bilateral: Secondary | ICD-10-CM | POA: Diagnosis not present

## 2023-03-16 ENCOUNTER — Other Ambulatory Visit: Payer: Self-pay | Admitting: Cardiology

## 2023-03-16 DIAGNOSIS — I7121 Aneurysm of the ascending aorta, without rupture: Secondary | ICD-10-CM

## 2023-03-16 DIAGNOSIS — I10 Essential (primary) hypertension: Secondary | ICD-10-CM

## 2023-04-03 ENCOUNTER — Other Ambulatory Visit: Payer: Self-pay | Admitting: Surgery

## 2023-04-03 DIAGNOSIS — I7121 Aneurysm of the ascending aorta, without rupture: Secondary | ICD-10-CM

## 2023-04-17 ENCOUNTER — Other Ambulatory Visit: Payer: Self-pay | Admitting: Cardiology

## 2023-04-17 DIAGNOSIS — I480 Paroxysmal atrial fibrillation: Secondary | ICD-10-CM

## 2023-04-20 ENCOUNTER — Telehealth: Payer: Self-pay | Admitting: Cardiology

## 2023-04-20 NOTE — Telephone Encounter (Signed)
Pt would like to know if he needs a Aorta scan. Please advise

## 2023-04-20 NOTE — Telephone Encounter (Signed)
Patient would like to know if he should proceed with CT scan scheduled for October with TCTS.  Per office visit note from Dr. Jacinto Halim on 01/03/2023: 4. Aneurysm of ascending aorta without rupture (HCC) I reviewed the results of the CT scan of the chest, he is ascending aortic aneurysm has remained stable over the past 3 to 4 years.  I do not think he needs 6 monthly surveillance CT scan, annual CT scan is appropriate.  I will take over his surveillance, I will place orders for his CT scan to be done next year.  Patient verbalized understanding and states he will cancel appt for CT scan in October and will have CT done in June 2025 as recommended by Dr. Jacinto Halim.  Patient expressed appreciation for assistance today.

## 2023-05-17 ENCOUNTER — Other Ambulatory Visit: Payer: Medicare Other

## 2023-05-21 ENCOUNTER — Ambulatory Visit: Payer: Medicare Other

## 2023-05-29 DIAGNOSIS — T148XXA Other injury of unspecified body region, initial encounter: Secondary | ICD-10-CM | POA: Diagnosis not present

## 2023-05-29 DIAGNOSIS — M5459 Other low back pain: Secondary | ICD-10-CM | POA: Diagnosis not present

## 2023-06-19 DIAGNOSIS — E875 Hyperkalemia: Secondary | ICD-10-CM | POA: Diagnosis not present

## 2023-06-19 DIAGNOSIS — M109 Gout, unspecified: Secondary | ICD-10-CM | POA: Diagnosis not present

## 2023-06-19 DIAGNOSIS — Z0000 Encounter for general adult medical examination without abnormal findings: Secondary | ICD-10-CM | POA: Diagnosis not present

## 2023-06-19 DIAGNOSIS — I480 Paroxysmal atrial fibrillation: Secondary | ICD-10-CM | POA: Diagnosis not present

## 2023-06-19 DIAGNOSIS — D692 Other nonthrombocytopenic purpura: Secondary | ICD-10-CM | POA: Diagnosis not present

## 2023-06-19 DIAGNOSIS — I10 Essential (primary) hypertension: Secondary | ICD-10-CM | POA: Diagnosis not present

## 2023-06-19 DIAGNOSIS — I6523 Occlusion and stenosis of bilateral carotid arteries: Secondary | ICD-10-CM | POA: Diagnosis not present

## 2023-06-19 DIAGNOSIS — D6869 Other thrombophilia: Secondary | ICD-10-CM | POA: Diagnosis not present

## 2023-06-19 DIAGNOSIS — J929 Pleural plaque without asbestos: Secondary | ICD-10-CM | POA: Diagnosis not present

## 2023-06-23 ENCOUNTER — Encounter (HOSPITAL_BASED_OUTPATIENT_CLINIC_OR_DEPARTMENT_OTHER): Payer: Self-pay | Admitting: Emergency Medicine

## 2023-06-23 ENCOUNTER — Other Ambulatory Visit: Payer: Self-pay

## 2023-06-23 ENCOUNTER — Emergency Department (HOSPITAL_BASED_OUTPATIENT_CLINIC_OR_DEPARTMENT_OTHER)
Admission: EM | Admit: 2023-06-23 | Discharge: 2023-06-23 | Disposition: A | Discharge status: Home / Self Care | Payer: Medicare Other | Attending: Emergency Medicine | Admitting: Emergency Medicine

## 2023-06-23 DIAGNOSIS — W28XXXA Contact with powered lawn mower, initial encounter: Secondary | ICD-10-CM | POA: Diagnosis not present

## 2023-06-23 DIAGNOSIS — I4891 Unspecified atrial fibrillation: Secondary | ICD-10-CM | POA: Diagnosis not present

## 2023-06-23 DIAGNOSIS — S91011A Laceration without foreign body, right ankle, initial encounter: Secondary | ICD-10-CM | POA: Diagnosis not present

## 2023-06-23 DIAGNOSIS — Z7901 Long term (current) use of anticoagulants: Secondary | ICD-10-CM | POA: Diagnosis not present

## 2023-06-23 DIAGNOSIS — S99911A Unspecified injury of right ankle, initial encounter: Secondary | ICD-10-CM | POA: Diagnosis present

## 2023-06-23 MED ORDER — LIDOCAINE-EPINEPHRINE (PF) 2 %-1:200000 IJ SOLN
10.0000 mL | Freq: Once | INTRAMUSCULAR | Status: AC
  Administered 2023-06-23: 10 mL
  Filled 2023-06-23: qty 20

## 2023-06-23 NOTE — ED Provider Notes (Signed)
5:16 PM Patient seen in conjunction with [place] PA-C.  Patient has a left medial ankle laceration, approximately 2 cm, bleeding controlled.  He is on Eliquis for atrial fibrillation.  Patient can flex and extend the ankle and is bearing weight without any difficulties.  Distal sensation intact.  Normal strength of the ankle.  2+ DP pulse.  Low concern for foreign body or fracture.  Wound should be able to be closed without much difficulty.  Agree with local anesthesia, wound closure.  BP (!) 143/77   Pulse 86   Temp 97.8 F (36.6 C)   Resp (!) 24   Wt 108.9 kg   SpO2 100%   BMI 31.66 kg/m     Renne Crigler, PA-C 06/23/23 1717    Laurence Spates, MD 06/24/23 [phone]

## 2023-06-23 NOTE — ED Notes (Signed)
Home wound dressing removed. Laceration appears to be ~3cm-bleeding controlled without pressure. Area cleaned with sterile water. Moist gauze applied with pressure wrap.

## 2023-06-23 NOTE — ED Notes (Signed)
Wound rinsed with 1000 mL sterile water. Moist gauze applied. Lidocaine and suture cart at bedside.

## 2023-06-23 NOTE — Discharge Instructions (Signed)
Is a pleasure taking care of you this afternoon.  You were evaluated in the emergency room for a laceration to your ankle.  The wound was sutured closed.  And dressed in sterile dressing.  Please keep the wound clean and dry for the next week.  You may shower tomorrow for patient's but dry off the wound and reapply sterile dressing with bacitracin.  Please present to the ED, urgent care or your primary care office to have the sutures removed in 10 days.  You experience any new or worsening symptoms including worsening ankle pain, unable to bear weight, fevers or chills please return to the emergency room.

## 2023-06-23 NOTE — ED Notes (Signed)
Reviewed discharge instructions and home care with pt. Pt verbalized understanding and had no further questions. Pt exited ED without complications.

## 2023-06-23 NOTE — ED Provider Notes (Signed)
[place] EMERGENCY DEPARTMENT AT [facility] Provider Note   CSN: [id number] Arrival date & time: 06/23/23  1630     History  Chief Complaint  Patient presents with   Extremity Laceration    On thinners    Robert Hensley is a 82 y.o. male on Eliquis who presents for laceration to his left ankle.  States he was using the lawn mower to pick up leaves when he had the reverse by accident and drove over his ankle.  He denies any pain.  No numbness or tingling.  Is able to weight-bear without difficulty.  He is up-to-date on his tetanus.  HPI     Home Medications Prior to Admission medications   Medication Sig Start Date End Date Taking? Authorizing Provider  amiodarone (PACERONE) 200 MG tablet TAKE ONE-HALF TABLET BY MOUTH  DAILY 04/17/23   Yates Decamp, MD  amLODipine (NORVASC) 10 MG tablet Take 10 mg by mouth daily.    [provider]  atorvastatin (LIPITOR) 40 MG tablet Take 40 mg by mouth daily.    [provider]  B Complex Vitamins (B COMPLEX-B12 PO) Take 1 tablet by mouth daily.    [provider]  diphenhydramine-acetaminophen (TYLENOL PM) 25-500 MG TABS tablet Take 1 tablet by mouth at bedtime.    [provider]  ELIQUIS 5 MG TABS tablet TAKE 1 TABLET BY MOUTH TWICE  DAILY 10/23/22   Yates Decamp, MD  Melatonin 10 MG TABS Take 10 mg by mouth at bedtime.    [provider]  metoprolol tartrate (LOPRESSOR) 25 MG tablet Take 12.5 mg by mouth 2 (two) times daily.     [provider]  Multiple Vitamin (MULTIVITAMIN) tablet Take 1 tablet by mouth daily.    [provider]  Multiple Vitamins-Minerals (EQ VISION FORMULA 50+ PO) Take 1 capsule by mouth daily.    [provider]  olmesartan-hydrochlorothiazide (BENICAR HCT) 40-25 MG tablet TAKE 1 TABLET BY MOUTH IN THE  MORNING 03/16/23   Yates Decamp, MD  vitamin C (ASCORBIC ACID) 500 MG tablet Take 500 mg by mouth daily.     [provider]       Allergies    Penicillins, Benzocaine, and Other    Review of Systems   Review of Systems  Skin:  Positive for wound.    Physical Exam Updated Vital Signs BP 130/66   Pulse 69   Temp 97.8 F (36.6 C)   Resp (!) 24   Wt 108.9 kg   SpO2 97%   BMI 31.66 kg/m  Physical Exam Vitals and nursing note reviewed.  Constitutional:      General: He is not in acute distress.    Appearance: He is well-developed.  HENT:     Head: Normocephalic and atraumatic.  Eyes:     Conjunctiva/sclera: Conjunctivae normal.  Cardiovascular:     Rate and Rhythm: Normal rate.     Pulses: Normal pulses.  Pulmonary:     Effort: Pulmonary effort is normal. No respiratory distress.  Musculoskeletal:     Cervical back: Neck supple.     Comments: 3 to 4 cm oblique laceration across the anterior medial aspect of the left ankle.  Hip hemostasis achieved.  NVI.  Full active range of motion of ankle and digits.  Good cap refill.  No bony tenderness.  Ambulates without difficulty  Skin:    General: Skin is warm and dry.     Capillary Refill: Capillary refill takes less than  2 seconds.  Neurological:     Mental Status: He is alert.  Psychiatric:        Mood and Affect: Mood normal.     ED Results / Procedures / Treatments   Labs (all labs ordered are listed, but only abnormal results are displayed) Labs Reviewed - No data to display  EKG None  Radiology No results found.  Procedures .Laceration Repair  Date/Time: 06/23/2023 7:02 PM  Performed by: Halford Decamp, PA-C Authorized by: Halford Decamp, PA-C   Consent:    Consent obtained:  Verbal   Consent given by:  Patient   Risks, benefits, and alternatives were discussed: yes     Risks discussed:  Infection, pain and poor cosmetic result   Alternatives discussed:  Observation Universal protocol:    Procedure explained and questions answered to patient or proxy's satisfaction: yes     Immediately prior to procedure, a time out  was called: yes     Patient identity confirmed:  Verbally with patient and arm band Anesthesia:    Anesthesia method:  Local infiltration   Local anesthetic:  Lidocaine 2% WITH epi Laceration details:    Location:  Foot   Foot location:  R ankle   Length (cm):  4 Pre-procedure details:    Preparation:  Patient was prepped and draped in usual sterile fashion Exploration:    Hemostasis achieved with:  Direct pressure   Wound exploration: wound explored through full range of motion   Treatment:    Area cleansed with:  Povidone-iodine and saline   Irrigation solution:  Sterile saline Skin repair:    Repair method:  Sutures   Suture size:  4-0   Suture material:  Prolene   Suture technique:  Simple interrupted   Number of sutures:  6 Repair type:    Repair type:  Simple Post-procedure details:    Dressing:  Antibiotic ointment and sterile dressing     Medications Ordered in ED Medications  lidocaine-EPINEPHrine (XYLOCAINE W/EPI) 2 %-1:200000 (PF) injection 10 mL (10 mLs Other Given 06/23/23 1754)    ED Course/ Medical Decision Making/ A&P                                 Medical Decision Making  This patient presents to the ED with chief complaint(s) of ankle laceration with pertinent past medical history of A-fib on Eliquis.  The complaint involves an extensive differential diagnosis and also carries with it a high risk of complications and morbidity.    The differential diagnosis includes  Fracture, retained foreign body, tendon or nerve injury, simple laceration The initial plan is to  Will suture close Additional history obtained: Additional historians are records utilized Initial Assessment:   Patient is well-appearing, in no significant pain, he denies any discomfort and is able to ambulate without difficulty.  No bony tenderness.  Wound without evidence of foreign body.  Laceration is fairly superficial.  Will hold off on x-ray at this time  Independent ECG  interpretation:  None  Independent labs interpretation:  The following labs were independently interpreted:  None  Independent visualization and interpretation of imaging: None  Treatment and Reassessment: Laceration sutured closed without difficulty  Consultations obtained:   None  Disposition:   Patient discharged home.  Instructed on follow-up and 8 to 10 days for suture removal. The patient has been appropriately medically screened and/or stabilized in the ED. I have low  suspicion for any other emergent medical condition which would require further screening, evaluation or treatment in the ED or require inpatient management. At time of discharge the patient is hemodynamically stable and in no acute distress. I have discussed work-up results and diagnosis with patient and answered all questions. Patient is agreeable with discharge plan. We discussed strict return precautions for returning to the emergency department and they verbalized understanding.     Social Determinants of Health:   None  This note was dictated with voice recognition software.  Despite best efforts at proofreading, errors may have occurred which can change the documentation meaning.           Final Clinical Impression(s) / ED Diagnoses Final diagnoses:  Laceration of right ankle, initial encounter    Rx / DC Orders ED Discharge Orders     None         [place], Korzeniewski 06/23/23 1904    Laurence Spates, MD 06/24/23 1453

## 2023-06-23 NOTE — ED Notes (Signed)
ED Provider at bedside. 

## 2023-06-23 NOTE — ED Triage Notes (Signed)
Pt was mowing, stepped off his lawn mower, hit the reverse by accident and the lawn mower drove over his left foot (the deck of the mower was on his foot and he had to lift off).

## 2023-06-23 NOTE — ED Notes (Signed)
Irrigated wound with sterile water, applied bactrim ointment. Dressed with non-stick pad and ace wrap applied to left ankle. Pt tolerated well.

## 2023-07-03 DIAGNOSIS — K625 Hemorrhage of anus and rectum: Secondary | ICD-10-CM | POA: Diagnosis not present

## 2023-07-03 DIAGNOSIS — K5904 Chronic idiopathic constipation: Secondary | ICD-10-CM | POA: Diagnosis not present

## 2023-07-03 DIAGNOSIS — S81812D Laceration without foreign body, left lower leg, subsequent encounter: Secondary | ICD-10-CM | POA: Diagnosis not present

## 2023-07-03 DIAGNOSIS — Z4802 Encounter for removal of sutures: Secondary | ICD-10-CM | POA: Diagnosis not present

## 2023-07-05 ENCOUNTER — Other Ambulatory Visit: Payer: Medicare Other

## 2023-07-05 ENCOUNTER — Ambulatory Visit (HOSPITAL_COMMUNITY)
Admission: RE | Admit: 2023-07-05 | Discharge: 2023-07-05 | Disposition: A | Discharge status: Home / Self Care | Payer: Medicare Other | Source: Ambulatory Visit | Attending: Cardiology | Admitting: Cardiology

## 2023-07-05 DIAGNOSIS — I6523 Occlusion and stenosis of bilateral carotid arteries: Secondary | ICD-10-CM | POA: Diagnosis not present

## 2023-07-12 ENCOUNTER — Encounter: Payer: Self-pay | Admitting: Cardiology

## 2023-07-12 ENCOUNTER — Ambulatory Visit: Payer: Medicare Other | Attending: Cardiology | Admitting: Cardiology

## 2023-07-12 VITALS — BP 124/72 | HR 67 | Resp 16 | Ht 73.0 in | Wt 243.6 lb

## 2023-07-12 DIAGNOSIS — I6523 Occlusion and stenosis of bilateral carotid arteries: Secondary | ICD-10-CM

## 2023-07-12 DIAGNOSIS — I10 Essential (primary) hypertension: Secondary | ICD-10-CM | POA: Diagnosis not present

## 2023-07-12 DIAGNOSIS — I480 Paroxysmal atrial fibrillation: Secondary | ICD-10-CM

## 2023-07-12 DIAGNOSIS — I7121 Aneurysm of the ascending aorta, without rupture: Secondary | ICD-10-CM | POA: Diagnosis not present

## 2023-07-12 DIAGNOSIS — S91312D Laceration without foreign body, left foot, subsequent encounter: Secondary | ICD-10-CM | POA: Diagnosis not present

## 2023-07-12 DIAGNOSIS — K5900 Constipation, unspecified: Secondary | ICD-10-CM | POA: Diagnosis not present

## 2023-07-12 NOTE — Patient Instructions (Signed)
 Medication Instructions:  Your physician recommends that you continue on your current medications as directed. Please refer to the Current Medication list given to you today.  *If you need a refill on your cardiac medications before your next appointment, please call your pharmacy*   Lab Work: none If you have labs (blood work) drawn today and your tests are completely normal, you will receive your results only by: MyChart Message (if you have MyChart) OR A paper copy in the mail If you have any lab test that is abnormal or we need to change your treatment, we will call you to review the results.   Testing/Procedures: Your physician has requested that you have an echocardiogram. Echocardiography is a painless test that uses sound waves to create images of your heart. It provides your doctor with information about the size and shape of your heart and how well your heart's chambers and valves are working. This procedure takes approximately one hour. There are no restrictions for this procedure. Please do NOT wear cologne, perfume, aftershave, or lotions (deodorant is allowed). Please arrive 15 minutes prior to your appointment time.  Please note: We ask at that you not bring children with you during ultrasound (echo/ vascular) testing. Due to room size and safety concerns, children are not allowed in the ultrasound rooms during exams. Our front office staff cannot provide observation of children in our lobby area while testing is being conducted. An adult accompanying a patient to their appointment will only be allowed in the ultrasound room at the discretion of the ultrasound technician under special circumstances. We apologize for any inconvenience.    Follow-Up: At [facility], you and your health needs are our priority.  As part of our continuing mission to provide you with exceptional heart care, we have created designated Provider Care Teams.  These Care Teams include your  primary Cardiologist (physician) and Advanced Practice Providers (APPs -  Physician Assistants and Nurse Practitioners) who all work together to provide you with the care you need, when you need it.  We recommend signing up for the patient portal called "MyChart".  Sign up information is provided on this After Visit Summary.  MyChart is used to connect with patients for Virtual Visits (Telemedicine).  Patients are able to view lab/test results, encounter notes, upcoming appointments, etc.  Non-urgent messages can be sent to your provider as well.   To learn more about what you can do with MyChart, go to [email or web address].    Your next appointment:   6 month(s)  Provider:   Yates Decamp, MD     Other Instructions

## 2023-07-12 NOTE — Progress Notes (Signed)
Cardiology Office Note:  .   Date:  07/12/2023  ID:  Robert Hensley, DOB March 02, 1941, MRN [id number] PCP: Merri Brunette, MD  [facility] Providers Cardiologist:  Yates Decamp, MD   History of Present Illness: .   Robert Hensley is a 82 y.o. Caucasian male with paroxysmal atrial fibrillation S/P DCCV in 2019, asymptomatic bilateral carotid artery atherosclerosis, hypertension, hyperlipidemia, COPD and prior tobacco use disorder, ascending aortic aneurysm measuring 4.6 cm, no significant coronary disease by angiography on 08/28/2017 moderate PAD with stable claudication.,  chronic PVCs and feels occasional palpitations.    Discussed the use of AI scribe software for clinical note transcription with the patient, who gave verbal consent to proceed.  History of Present Illness   The patient, with a history of atrial fibrillation, peripheral arterial disease, and aortic aneurysm, presents for follow-up and evaluation of his cardiac issues and follow-up of carotid artery duplex.  From cardiac standpoint is presently doing well. The patient remains active, working three mornings a week for three and a half hours at a time, which involves standing and moving around. He reports feeling tired by the end of this, but denies any leg cramping or weakness. He also denies any chest pain or shortness of breath, and reports no changes in his health over the past six months.  He reports urinating every 1.5-2 hours, both during the day and at night, which has been ongoing for several years. He also reports occasional constipation, which he has attempted to manage with a laxative and stool softener combination, but with limited success. He has tried Metamucil in the past, but only for a few days.  The patient has a history of prostate cancer, for which he underwent a radical prostatectomy in 1993. He denies any blood in his urine. He also reports some swelling in his legs.   Review of Systems  Cardiovascular:   Positive for dyspnea on exertion (stable) and leg swelling (stable). Negative for chest pain.  Gastrointestinal:  Positive for constipation.   Labs       Latest Ref Rng & Units 11/11/2021    9:39 AM 02/14/2021    6:52 PM 02/10/2021    2:34 PM  CBC  WBC 3.4 - 10.8 x10E3/uL 6.8  7.4  17.0   Hemoglobin 13.0 - 17.7 g/dL 40.9  81.1  91.4   Hematocrit 37.5 - 51.0 % 37.9  39.6  40.5   Platelets 150 - 450 x10E3/uL 241  220  233    Lab Results  Component Value Date   TSH 2.400 09/22/2019    External Labs:  Labs 06/14/2023:  Serum glucose 96 mg, BUN 13, creatinine 0.73, EGFR 90 mL, potassium 5.3, LFTs normal.  Hb 14.6/HCT 42.3, platelets 230.  Total cholesterol 127, triglycerides 63, HDL 61, LDL 53.   Physical Exam:   VS:  BP 124/72 (BP Location: Left Arm, Patient Position: Sitting, Cuff Size: Large)   Pulse 67   Resp 16   Ht 6\' 1"  (1.854 m)   Wt 243 lb 9.6 oz (110.5 kg)   SpO2 98%   BMI 32.14 kg/m    Wt Readings from Last 3 Encounters:  07/12/23 243 lb 9.6 oz (110.5 kg)  06/23/23 240 lb (108.9 kg)  01/03/23 248 lb 9.6 oz (112.8 kg)     Physical Exam Neck:     Vascular: Carotid bruit (right) present. No JVD.  Cardiovascular:     Rate and Rhythm: Normal rate and regular rhythm.  Pulses: Intact distal pulses.          Dorsalis pedis pulses are 0 on the right side and 1+ on the left side.       Posterior tibial pulses are 0 on the right side and 0 on the left side.     Heart sounds: Normal heart sounds. No murmur heard.    No gallop.  Pulmonary:     Effort: Pulmonary effort is normal.     Breath sounds: Normal breath sounds.  Abdominal:     General: Bowel sounds are normal.     Palpations: Abdomen is soft.  Musculoskeletal:     Right lower leg: Edema (2+ pitting below knee) present.     Left lower leg: Edema (2+ pitting below knee) present.     Studies Reviewed: .    CT angiogram chest 11/08/2022: Stable aneurysmal dilatation of the ascending aorta to 4.6  cm. Recommend semi-annual imaging followup by CTA or MRA and referral to cardiothoracic surgery if not already obtained. Stable since 04/22/2021  Echocardiogram 11/17/2021: Normal LV systolic function with visual EF 55-60%. Left ventricle cavity is normal in size. Normal left ventricular wall thickness. Normal global wall motion. Normal diastolic filling pattern, normal LAP. Mild (Grade I) aortic regurgitation. Aortic valve sclerosis without stenosis. The aortic root and proximal ascending aorta slightly above normal limits (Sinus of Valsalva 38mm and proximal ascending aorta 38mm) Compared to 08/08/2017 no significant change.  Carotid artery duplex 07/05/2023 Right Carotid: Velocities in the right ICA are consistent with a 1-39% stenosis.  Left Carotid: Velocities in the left ICA are consistent with a 1-39% stenosis.  Vertebrals: Left vertebral artery demonstrates antegrade flow. Right vertebral  artery exhibits systolic deceleration.  There is a 30 mmHg difference between brachial pressures, suggestive of possible left proximal stenosis.  Subclavians: Right subclavian artery flow was disturbed.   EKG:    EKG Interpretation Date/Time:  Thursday July 12 2023 09:43:38 EST Ventricular Rate:  67 PR Interval:  236 QRS Duration:  96 QT Interval:  392 QTC Calculation: 414 R Axis:   -43  Text Interpretation: EKG 07/12/2023: Sinus rhythm with first-degree AV block at the rate of 67 bpm with frequent PVCs (3).  Left anterior fascicular block.  Incomplete right bundle branch block.  Poor R progression, probably normal variant, but cannot exclude anteroseptal infarct old.  Compared to 02/14/2021, no significant change. Confirmed by Delrae Rend [phone]) on 07/12/2023 10:03:54 AM    Medications and allergies    Allergies  Allergen Reactions   Penicillins Anaphylaxis and Other (See Comments)    "throat swelling with synthetic penicillins" Has patient had a PCN reaction causing immediate  rash, facial/tongue/throat swelling, SOB or lightheadedness with hypotension: No Has patient had a PCN reaction causing severe rash involving mucus membranes or skin necrosis: No Has patient had a PCN reaction that required hospitalization: Yes Has patient had a PCN reaction occurring within the last 10 years: No If all of the above answers are "NO", then may proceed with Cephalosporin use.    Benzocaine Other (See Comments)    Hypotensive -Pt unaware of this allergy    Other Other (See Comments)    General Anesthesia - BP drops      Current Outpatient Medications:    amiodarone (PACERONE) 200 MG tablet, TAKE ONE-HALF TABLET BY MOUTH  DAILY (Patient taking differently: Take 100 mg by mouth every other day.), Disp: 40 tablet, Rfl: 2   amLODipine (NORVASC) 10 MG tablet, Take 10 mg by  mouth daily., Disp: , Rfl:    atorvastatin (LIPITOR) 40 MG tablet, Take 40 mg by mouth daily., Disp: , Rfl:    B Complex Vitamins (B COMPLEX-B12 PO), Take 1 tablet by mouth daily., Disp: , Rfl:    diphenhydramine-acetaminophen (TYLENOL PM) 25-500 MG TABS tablet, Take 1 tablet by mouth at bedtime., Disp: , Rfl:    ELIQUIS 5 MG TABS tablet, TAKE 1 TABLET BY MOUTH TWICE  DAILY, Disp: 200 tablet, Rfl: 2   melatonin 5 MG TABS, Take 5 mg by mouth at bedtime. Take on extra if needed, Disp: , Rfl:    metoprolol tartrate (LOPRESSOR) 25 MG tablet, Take 12.5 mg by mouth 2 (two) times daily. , Disp: , Rfl:    Multiple Vitamin (MULTIVITAMIN) tablet, Take 1 tablet by mouth daily., Disp: , Rfl:    Multiple Vitamins-Minerals (EQ VISION FORMULA 50+ PO), Take 1 capsule by mouth daily., Disp: , Rfl:    olmesartan-hydrochlorothiazide (BENICAR HCT) 40-25 MG tablet, TAKE 1 TABLET BY MOUTH IN THE  MORNING, Disp: 100 tablet, Rfl: 2   vitamin C (ASCORBIC ACID) 500 MG tablet, Take 500 mg by mouth daily. , Disp: , Rfl:    ASSESSMENT AND PLAN: .      ICD-10-CM   1. Paroxysmal atrial fibrillation (HCC)  I48.0 EKG 12-Lead     ECHOCARDIOGRAM COMPLETE    2. Asymptomatic bilateral carotid artery stenosis  I65.23     3. Primary hypertension  I10     4. Aneurysm of ascending aorta without rupture (HCC)  I71.21 ECHOCARDIOGRAM COMPLETE      1. Paroxysmal atrial fibrillation (HCC) Stable, no episodes. EKG shows regular rhythm. Amiodarone 100mg  every other day and Eliquis 5mg  BID are well-tolerated with no bleeding issues. -Continue current regimen.  He is on amiodarone 100 mg every other day as well.  Also on Eliquis, continue anticoagulation.  Maintains sinus rhythm. -Order echocardiogram to monitor aortic aneurysm.  2. Asymptomatic bilateral carotid artery stenosis He has had significant progression of bilateral carotid stenosis now has only mild diffuse atherosclerotic plaque.  No further imaging study needed.  Continue aspirin and statins.  3. Primary hypertension Blood pressure well-controlled on amlodipine and olmesartan, renal function has remained stable, continue the same.  4. Aneurysm of ascending aorta without rupture (HCC) Stable, no new symptoms. Plan to monitor with echocardiogram instead of CT scan. -Order echocardiogram. -Consider cancelling scheduled CTA in June.  Peripheral Arterial Disease Stable, no new symptoms. Atorvastatin 40mg  daily is well-tolerated and cholesterol is well-controlled. -Continue current regimen.  Hypertension Well-controlled on Amlodipine and Olmesartan HCT. -Continue current regimen.  Ascending Aortic Aneurysm Stable, no new symptoms. Plan to monitor with echocardiogram instead of CT scan. -Order echocardiogram.  Constipation Reports occasional constipation. Tried Metamucil for 3 days with no improvement. -Advise to try Metamucil daily for at least 1-2 weeks.  Frequent Urination Reports frequent urination, especially at night. History of radical prostatectomy. -Recommend consultation with urologist. -Suggest discussing potential use of Viagra with urologist to  improve urinary symptoms.  Follow-up in 6 months.    Signed,  Yates Decamp, MD, [facility] 07/12/2023, 9:30 PM [facility] [address] [place], [place] [address] Phone: [phone]. Fax:  [phone]

## 2023-07-19 DIAGNOSIS — M545 Low back pain, unspecified: Secondary | ICD-10-CM | POA: Diagnosis not present

## 2023-07-23 ENCOUNTER — Encounter: Payer: Self-pay | Admitting: Gastroenterology

## 2023-07-24 ENCOUNTER — Observation Stay (HOSPITAL_BASED_OUTPATIENT_CLINIC_OR_DEPARTMENT_OTHER)
Admission: EM | Admit: 2023-07-24 | Discharge: 2023-07-26 | Disposition: A | Discharge status: Home / Self Care | Payer: Medicare Other | Attending: Internal Medicine | Admitting: Internal Medicine

## 2023-07-24 ENCOUNTER — Emergency Department (HOSPITAL_BASED_OUTPATIENT_CLINIC_OR_DEPARTMENT_OTHER): Payer: Medicare Other

## 2023-07-24 ENCOUNTER — Other Ambulatory Visit: Payer: Self-pay

## 2023-07-24 ENCOUNTER — Encounter (HOSPITAL_BASED_OUTPATIENT_CLINIC_OR_DEPARTMENT_OTHER): Payer: Self-pay

## 2023-07-24 ENCOUNTER — Emergency Department (HOSPITAL_BASED_OUTPATIENT_CLINIC_OR_DEPARTMENT_OTHER): Payer: Medicare Other | Admitting: Radiology

## 2023-07-24 DIAGNOSIS — Z79899 Other long term (current) drug therapy: Secondary | ICD-10-CM | POA: Insufficient documentation

## 2023-07-24 DIAGNOSIS — Z7901 Long term (current) use of anticoagulants: Secondary | ICD-10-CM | POA: Insufficient documentation

## 2023-07-24 DIAGNOSIS — R109 Unspecified abdominal pain: Secondary | ICD-10-CM | POA: Diagnosis present

## 2023-07-24 DIAGNOSIS — R0602 Shortness of breath: Secondary | ICD-10-CM | POA: Insufficient documentation

## 2023-07-24 DIAGNOSIS — Z87891 Personal history of nicotine dependence: Secondary | ICD-10-CM | POA: Diagnosis not present

## 2023-07-24 DIAGNOSIS — I480 Paroxysmal atrial fibrillation: Secondary | ICD-10-CM | POA: Insufficient documentation

## 2023-07-24 DIAGNOSIS — G8929 Other chronic pain: Secondary | ICD-10-CM | POA: Insufficient documentation

## 2023-07-24 DIAGNOSIS — I10 Essential (primary) hypertension: Secondary | ICD-10-CM | POA: Diagnosis not present

## 2023-07-24 DIAGNOSIS — E871 Hypo-osmolality and hyponatremia: Secondary | ICD-10-CM | POA: Insufficient documentation

## 2023-07-24 DIAGNOSIS — K5900 Constipation, unspecified: Secondary | ICD-10-CM | POA: Diagnosis not present

## 2023-07-24 DIAGNOSIS — M549 Dorsalgia, unspecified: Secondary | ICD-10-CM | POA: Diagnosis not present

## 2023-07-24 DIAGNOSIS — J9811 Atelectasis: Secondary | ICD-10-CM | POA: Diagnosis not present

## 2023-07-24 DIAGNOSIS — I77810 Thoracic aortic ectasia: Secondary | ICD-10-CM | POA: Diagnosis not present

## 2023-07-24 DIAGNOSIS — D72829 Elevated white blood cell count, unspecified: Secondary | ICD-10-CM | POA: Diagnosis not present

## 2023-07-24 DIAGNOSIS — J929 Pleural plaque without asbestos: Secondary | ICD-10-CM | POA: Diagnosis not present

## 2023-07-24 DIAGNOSIS — U071 COVID-19: Secondary | ICD-10-CM | POA: Diagnosis not present

## 2023-07-24 DIAGNOSIS — E869 Volume depletion, unspecified: Secondary | ICD-10-CM | POA: Diagnosis not present

## 2023-07-24 DIAGNOSIS — R911 Solitary pulmonary nodule: Secondary | ICD-10-CM | POA: Diagnosis not present

## 2023-07-24 DIAGNOSIS — Z8546 Personal history of malignant neoplasm of prostate: Secondary | ICD-10-CM | POA: Insufficient documentation

## 2023-07-24 DIAGNOSIS — J449 Chronic obstructive pulmonary disease, unspecified: Secondary | ICD-10-CM | POA: Diagnosis not present

## 2023-07-24 LAB — COMPREHENSIVE METABOLIC PANEL
ALT: 25 U/L (ref 0–44)
AST: 18 U/L (ref 15–41)
Albumin: 4 g/dL (ref 3.5–5.0)
Alkaline Phosphatase: 101 U/L (ref 38–126)
Anion gap: 9 (ref 5–15)
BUN: 23 mg/dL (ref 8–23)
CO2: 29 mmol/L (ref 22–32)
Calcium: 9.3 mg/dL (ref 8.9–10.3)
Chloride: 94 mmol/L — ABNORMAL LOW (ref 98–111)
Creatinine, Ser: 0.74 mg/dL (ref 0.61–1.24)
GFR, Estimated: >60 mL/min (ref >60)
Glucose, Bld: 118 mg/dL — ABNORMAL HIGH (ref 70–99)
Potassium: 3.9 mmol/L (ref 3.5–5.1)
Sodium: 132 mmol/L — ABNORMAL LOW (ref 135–145)
Total Bilirubin: 0.8 mg/dL (ref 0.0–1.2)
Total Protein: 6.7 g/dL (ref 6.5–8.1)

## 2023-07-24 LAB — CBC
HCT: 39.3 % (ref 39.0–52.0)
Hemoglobin: 13.6 g/dL (ref 13.0–17.0)
MCH: 30.6 pg (ref 26.0–34.0)
MCHC: 34.6 g/dL (ref 30.0–36.0)
MCV: 88.3 fL (ref 80.0–100.0)
Platelets: 321 10*3/uL (ref 150–400)
RBC: 4.45 MIL/uL (ref 4.22–5.81)
RDW: 13.8 % (ref 11.5–15.5)
WBC: 13 10*3/uL — ABNORMAL HIGH (ref 4.0–10.5)
nRBC: 0 % (ref 0.0–0.2)

## 2023-07-24 LAB — PROTIME-INR
INR: 1.2 (ref 0.8–1.2)
Prothrombin Time: 15 s (ref 11.4–15.2)

## 2023-07-24 LAB — URINALYSIS, W/ REFLEX TO CULTURE (INFECTION SUSPECTED)
Bacteria, UA: NONE SEEN
Bilirubin Urine: NEGATIVE
Glucose, UA: NEGATIVE mg/dL
Hgb urine dipstick: NEGATIVE
Ketones, ur: NEGATIVE mg/dL
Leukocytes,Ua: NEGATIVE
Nitrite: NEGATIVE
Specific Gravity, Urine: >1.046 — ABNORMAL HIGH (ref 1.005–1.030)
pH: 7 (ref 5.0–8.0)

## 2023-07-24 LAB — APTT: aPTT: 27 s (ref 24–36)

## 2023-07-24 LAB — RESP PANEL BY RT-PCR (RSV, FLU A&B, COVID)  RVPGX2
Influenza A by PCR: NEGATIVE
Influenza B by PCR: NEGATIVE
Resp Syncytial Virus by PCR: NEGATIVE
SARS Coronavirus 2 by RT PCR: POSITIVE — AB

## 2023-07-24 LAB — LIPASE, BLOOD: Lipase: 20 U/L (ref 11–51)

## 2023-07-24 LAB — LACTIC ACID, PLASMA: Lactic Acid, Venous: 1.1 mmol/L (ref 0.5–1.9)

## 2023-07-24 MED ORDER — LACTATED RINGERS IV BOLUS (SEPSIS)
1000.0000 mL | Freq: Once | INTRAVENOUS | Status: AC
  Administered 2023-07-24: 1000 mL via INTRAVENOUS

## 2023-07-24 MED ORDER — ONDANSETRON HCL 4 MG/2ML IJ SOLN
4.0000 mg | Freq: Once | INTRAMUSCULAR | Status: AC
  Administered 2023-07-24: 4 mg via INTRAVENOUS
  Filled 2023-07-24: qty 2

## 2023-07-24 MED ORDER — FENTANYL CITRATE PF 50 MCG/ML IJ SOSY
50.0000 ug | PREFILLED_SYRINGE | Freq: Once | INTRAMUSCULAR | Status: AC
  Administered 2023-07-24: 50 ug via INTRAVENOUS
  Filled 2023-07-24: qty 1

## 2023-07-24 MED ORDER — LACTATED RINGERS IV BOLUS (SEPSIS)
500.0000 mL | Freq: Once | INTRAVENOUS | Status: AC
Start: 2023-07-24 — End: 2023-07-24
  Administered 2023-07-24: 500 mL via INTRAVENOUS

## 2023-07-24 MED ORDER — SODIUM CHLORIDE 0.9 % IV SOLN
2.0000 g | Freq: Once | INTRAVENOUS | Status: AC
  Administered 2023-07-24: 2 g via INTRAVENOUS
  Filled 2023-07-24: qty 12.5

## 2023-07-24 MED ORDER — LACTATED RINGERS IV SOLN: INTRAVENOUS | Status: AC

## 2023-07-24 MED ORDER — IOHEXOL 350 MG/ML SOLN
100.0000 mL | Freq: Once | INTRAVENOUS | Status: AC | PRN
  Administered 2023-07-24: 100 mL via INTRAVENOUS

## 2023-07-24 MED ORDER — METRONIDAZOLE 500 MG/100ML IV SOLN
500.0000 mg | Freq: Once | INTRAVENOUS | Status: AC
  Administered 2023-07-24: 500 mg via INTRAVENOUS
  Filled 2023-07-24: qty 100

## 2023-07-24 NOTE — ED Provider Notes (Signed)
 [place] EMERGENCY DEPARTMENT AT [facility] Provider Note   CSN: [id number] Arrival date & time: 07/24/23  9147     History  Chief Complaint  Patient presents with   Abdominal Pain    Robert Hensley is a 82 y.o. male who presents the emergency department with chief complaint of abdominal distention and inability to make a bowel movement for the past 4 days.  Patient reports worsening abdominal distention, inability to pass gas or make a bowel movement x 4 days.  He has taken 3 days of both docusate sodium  and sennosides without any improvement.  He has mild associated nausea.  He took his blood pressure medication yesterday but has not taken it today.  Patient states that he feels really bad.  Patient is on anticoagulation with Eliquis  due to history of paroxysmal atrial fibrillation.  He has a history of an ascending aortic aneurysm.  He is currently taking prednisone  secondary to a bad back.  He denies chest pain or neurologic deficit.   Abdominal Pain      Home Medications Prior to Admission medications   Medication Sig Start Date End Date Taking? Authorizing Provider  amiodarone  (PACERONE ) 200 MG tablet TAKE ONE-HALF TABLET BY MOUTH  DAILY Patient taking differently: Take 100 mg by mouth every other day. 04/17/23   Ladona Heinz, MD  amLODipine (NORVASC) 10 MG tablet Take 10 mg by mouth daily.    [provider]  atorvastatin  (LIPITOR) 40 MG tablet Take 40 mg by mouth daily.    [provider]  B Complex Vitamins (B COMPLEX-B12 PO) Take 1 tablet by mouth daily.    [provider]  diphenhydramine-acetaminophen  (TYLENOL  PM) 25-500 MG TABS tablet Take 1 tablet by mouth at bedtime.    [provider]  ELIQUIS  5 MG TABS tablet TAKE 1 TABLET BY MOUTH TWICE  DAILY 10/23/22   Ladona Heinz, MD  melatonin 5 MG TABS Take 5 mg by mouth at bedtime. Take on extra if needed    [provider]  metoprolol  tartrate (LOPRESSOR ) 25 MG tablet  Take 12.5 mg by mouth 2 (two) times daily.     [provider]  Multiple Vitamin (MULTIVITAMIN) tablet Take 1 tablet by mouth daily.    [provider]  Multiple Vitamins-Minerals (EQ VISION FORMULA 50+ PO) Take 1 capsule by mouth daily.    [provider]  olmesartan -hydrochlorothiazide (BENICAR  HCT) 40-25 MG tablet TAKE 1 TABLET BY MOUTH IN THE  MORNING 03/16/23   Ladona Heinz, MD  vitamin C  (ASCORBIC ACID ) 500 MG tablet Take 500 mg by mouth daily.     [provider]      Allergies    Penicillins, Benzocaine, and Other    Review of Systems   Review of Systems  Gastrointestinal:  Positive for abdominal pain.    Physical Exam Updated Vital Signs BP 136/65   Pulse 75   Temp (!) 97.5 F (36.4 C) (Oral)   Resp 14   SpO2 98%  Physical Exam Vitals and nursing note reviewed.  Constitutional:      General: He is not in acute distress.    Appearance: He is well-developed. He is not diaphoretic.  HENT:     Head: Normocephalic and atraumatic.  Eyes:     General: No scleral icterus.    Conjunctiva/sclera: Conjunctivae normal.  Cardiovascular:     Rate and Rhythm: Normal rate and regular rhythm.     Heart sounds: Normal heart sounds.  Pulmonary:  Effort: Pulmonary effort is normal. No respiratory distress.     Breath sounds: Normal breath sounds.  Abdominal:     General: There is distension.     Palpations: Abdomen is soft.     Tenderness: There is abdominal tenderness. There is guarding.     Comments: Patient with marked distention of the abdomen, markedMarked distension of the abdomen. Tympanic to percussion. Tender with involuntary guarding.  Musculoskeletal:     Cervical back: Normal range of motion and neck supple.     Right lower leg: Edema present.     Left lower leg: Edema present.  Skin:    General: Skin is warm and dry.  Neurological:     Mental Status: He is alert.  Psychiatric:        Behavior: Behavior normal.     ED  Results / Procedures / Treatments   Labs (all labs ordered are listed, but only abnormal results are displayed) Labs Reviewed  CBC - Abnormal; Notable for the following components:      Result Value   WBC 13.0 (*)    All other components within normal limits  RESP PANEL BY RT-PCR (RSV, FLU A&B, COVID)  [id number]  CULTURE, BLOOD (ROUTINE X 2)  CULTURE, BLOOD (ROUTINE X 2)  LIPASE, BLOOD  COMPREHENSIVE METABOLIC PANEL  LACTIC ACID, PLASMA  LACTIC ACID, PLASMA  PROTIME-INR  APTT  URINALYSIS, W/ REFLEX TO CULTURE (INFECTION SUSPECTED)  I-STAT CHEM 8, ED    EKG None  Radiology No results found.  Procedures Procedures    Medications Ordered in ED Medications  lactated ringers  infusion (has no administration in time range)  lactated ringers  bolus 1,000 mL (has no administration in time range)    And  lactated ringers  bolus 1,000 mL (has no administration in time range)    And  lactated ringers  bolus 1,000 mL (has no administration in time range)    And  lactated ringers  bolus 500 mL (has no administration in time range)  ceFEPIme  (MAXIPIME ) 2 g in sodium chloride  0.9 % 100 mL IVPB (has no administration in time range)  metroNIDAZOLE  (FLAGYL ) IVPB 500 mg (has no administration in time range)  ondansetron  (ZOFRAN ) injection 4 mg (has no administration in time range)    ED Course/ Medical Decision Making/ A&P Clinical Course as of 07/24/23 1311  Tue Jul 24, 2023  0959 Patient arrives hypotensive with white blood cell count of 13, abdominal distention and pain.  History of GI bleed on Eliquis , history of ascending aortic aneurysm, inability to pass gas or make a bowel movement for the last several days.  Concern for bowel obstruction versus perforated abdominal aortic aneurysm , volvulus.  Patient meets sepsis criteria, labs, fluids, antibiotics and imaging ordered.  He appears stable at this time. [AH]  1112 SARS Coronavirus 2 by RT PCR(!): POSITIVE [JL]  1112 WBC(!): 13.0 [JL]   1159 SARS Coronavirus 2 by RT PCR(!): POSITIVE [AH]  1310 BP(!): 87/35 [AH]  1310 BP: 133/65 I visualized and interpreted CT angiogram of the abdomen and pelvis.  He has significant mesenteric arterial disease, no evidence of dissection.  His lactate is normal and have low suspicion for acute mesenteric ischemia.  No cecal volvulus on exam but he does have a large amount of stool on the right side of the colon.  No evidence of impaction. [AH]    Clinical Course User Index [AH] Arloa Chroman, PA-C [JL] Jerrol Agent, MD  Medical Decision Making 82 year old male here with acute abdominal pain, distention, inability to pass gas or make a bowel movement for 4 days.  He arrives hypotensive.  He has had associated nausea and poor oral intake.  Last dose of Eliquis  last night around 8 PM.  After review of all data points patient appears to be positive for COVID-19 infection with severe right sided constipation likely mimicking a bowel obstruction.  Given his failure to thrive scenario, poor oral intake patient will likely need admission and bowel regimen.  Patient does not appear to have a volvulus or surgical process.  Previously we discussed findings with surgery. They reviewed images and agree   Since symptoms also not consistent with sepsis. Code sepsis canceled.  Patient with constipation, poor oral intake Will need admission for bowel regimen   Amount and/or Complexity of Data Reviewed Labs: ordered. Decision-making details documented in ED Course.    Details: ED course Radiology: ordered and independent interpretation performed.    Details: Per ED course ECG/medicine tests: ordered and independent interpretation performed.  Risk Prescription drug management. Decision regarding hospitalization.           Final Clinical Impression(s) / ED Diagnoses Final diagnoses:  None    Rx / DC Orders ED Discharge Orders     None          Arloa Chroman, PA-C 07/27/23 1308    Jerrol Agent, MD 07/30/23 1500

## 2023-07-24 NOTE — Progress Notes (Signed)
Elink is following code sepsis 

## 2023-07-24 NOTE — Progress Notes (Signed)
 Patient ID: CONSTANT MANDEVILLE, male   DOB: 14-Sep-1940, 82 y.o.   MRN: [id number] We were consulted for possible cecal volvulus. CT does not show this but does show R sided constipation. No acute surgical process. Noted +COVID. We will hold off on surgical consult at this time.  Dann Hummer, MD, MPH, FACS Please use [email or web address] to contact on call provider

## 2023-07-24 NOTE — ED Notes (Signed)
 Patient transported to CT

## 2023-07-24 NOTE — ED Triage Notes (Signed)
 He c/o constipation x 4-5 days. He states he is newly on some medications "for my back" (prednisone, Ultram and cyclobenzaprine). He is ambulatory and in no distress.

## 2023-07-25 DIAGNOSIS — I480 Paroxysmal atrial fibrillation: Secondary | ICD-10-CM | POA: Diagnosis not present

## 2023-07-25 DIAGNOSIS — E871 Hypo-osmolality and hyponatremia: Secondary | ICD-10-CM | POA: Diagnosis not present

## 2023-07-25 DIAGNOSIS — Z79899 Other long term (current) drug therapy: Secondary | ICD-10-CM | POA: Diagnosis not present

## 2023-07-25 DIAGNOSIS — J449 Chronic obstructive pulmonary disease, unspecified: Secondary | ICD-10-CM | POA: Diagnosis not present

## 2023-07-25 DIAGNOSIS — U071 COVID-19: Secondary | ICD-10-CM | POA: Diagnosis not present

## 2023-07-25 DIAGNOSIS — R0602 Shortness of breath: Secondary | ICD-10-CM | POA: Diagnosis not present

## 2023-07-25 DIAGNOSIS — R911 Solitary pulmonary nodule: Secondary | ICD-10-CM | POA: Diagnosis not present

## 2023-07-25 DIAGNOSIS — K5900 Constipation, unspecified: Secondary | ICD-10-CM | POA: Diagnosis not present

## 2023-07-25 DIAGNOSIS — Z8546 Personal history of malignant neoplasm of prostate: Secondary | ICD-10-CM | POA: Diagnosis not present

## 2023-07-25 DIAGNOSIS — G8929 Other chronic pain: Secondary | ICD-10-CM | POA: Diagnosis not present

## 2023-07-25 DIAGNOSIS — D72829 Elevated white blood cell count, unspecified: Secondary | ICD-10-CM | POA: Diagnosis not present

## 2023-07-25 DIAGNOSIS — I10 Essential (primary) hypertension: Secondary | ICD-10-CM | POA: Diagnosis not present

## 2023-07-25 DIAGNOSIS — R109 Unspecified abdominal pain: Secondary | ICD-10-CM | POA: Diagnosis present

## 2023-07-25 DIAGNOSIS — Z87891 Personal history of nicotine dependence: Secondary | ICD-10-CM | POA: Diagnosis not present

## 2023-07-25 DIAGNOSIS — M549 Dorsalgia, unspecified: Secondary | ICD-10-CM | POA: Diagnosis not present

## 2023-07-25 DIAGNOSIS — Z7901 Long term (current) use of anticoagulants: Secondary | ICD-10-CM | POA: Diagnosis not present

## 2023-07-25 LAB — CBC WITH DIFFERENTIAL/PLATELET
Abs Immature Granulocytes: 0.06 10*3/uL (ref 0.00–0.07)
Basophils Absolute: 0 10*3/uL (ref 0.0–0.1)
Basophils Relative: 0 %
Eosinophils Absolute: 0 10*3/uL (ref 0.0–0.5)
Eosinophils Relative: 0 %
HCT: 39.2 % (ref 39.0–52.0)
Hemoglobin: 13.4 g/dL (ref 13.0–17.0)
Immature Granulocytes: 1 %
Lymphocytes Relative: 14 %
Lymphs Abs: 1.7 10*3/uL (ref 0.7–4.0)
MCH: 30.9 pg (ref 26.0–34.0)
MCHC: 34.2 g/dL (ref 30.0–36.0)
MCV: 90.5 fL (ref 80.0–100.0)
Monocytes Absolute: 0.9 10*3/uL (ref 0.1–1.0)
Monocytes Relative: 7 %
Neutro Abs: 9.7 10*3/uL — ABNORMAL HIGH (ref 1.7–7.7)
Neutrophils Relative %: 78 %
Platelets: 315 10*3/uL (ref 150–400)
RBC: 4.33 MIL/uL (ref 4.22–5.81)
RDW: 14.1 % (ref 11.5–15.5)
WBC: 12.4 10*3/uL — ABNORMAL HIGH (ref 4.0–10.5)
nRBC: 0 % (ref 0.0–0.2)

## 2023-07-25 LAB — BASIC METABOLIC PANEL
Anion gap: 7 (ref 5–15)
BUN: 19 mg/dL (ref 8–23)
CO2: 28 mmol/L (ref 22–32)
Calcium: 8.6 mg/dL — ABNORMAL LOW (ref 8.9–10.3)
Chloride: 93 mmol/L — ABNORMAL LOW (ref 98–111)
Creatinine, Ser: 0.56 mg/dL — ABNORMAL LOW (ref 0.61–1.24)
GFR, Estimated: >60 mL/min (ref >60)
Glucose, Bld: 100 mg/dL — ABNORMAL HIGH (ref 70–99)
Potassium: 4 mmol/L (ref 3.5–5.1)
Sodium: 128 mmol/L — ABNORMAL LOW (ref 135–145)

## 2023-07-25 LAB — BRAIN NATRIURETIC PEPTIDE: B Natriuretic Peptide: 84.3 pg/mL (ref 0.0–100.0)

## 2023-07-25 MED ORDER — ACETAMINOPHEN 325 MG PO TABS
650.0000 mg | ORAL_TABLET | Freq: Four times a day (QID) | ORAL | Status: DC | PRN
  Administered 2023-07-25: 650 mg via ORAL
  Filled 2023-07-25: qty 2

## 2023-07-25 MED ORDER — MAGNESIUM HYDROXIDE 400 MG/5ML PO SUSP
15.0000 mL | Freq: Once | ORAL | Status: AC
  Administered 2023-07-25: 15 mL via ORAL
  Filled 2023-07-25: qty 30

## 2023-07-25 MED ORDER — MELATONIN 5 MG PO TABS
5.0000 mg | ORAL_TABLET | Freq: Every evening | ORAL | Status: DC | PRN
  Administered 2023-07-25: 5 mg via ORAL
  Filled 2023-07-25: qty 1

## 2023-07-25 MED ORDER — ATORVASTATIN CALCIUM 40 MG PO TABS
40.0000 mg | ORAL_TABLET | Freq: Every day | ORAL | Status: DC
  Administered 2023-07-25: 40 mg via ORAL
  Filled 2023-07-25 (×2): qty 1

## 2023-07-25 MED ORDER — APIXABAN 5 MG PO TABS
5.0000 mg | ORAL_TABLET | Freq: Two times a day (BID) | ORAL | Status: DC
  Administered 2023-07-25 – 2023-07-26 (×3): 5 mg via ORAL
  Filled 2023-07-25 (×3): qty 1

## 2023-07-25 MED ORDER — PROCHLORPERAZINE EDISYLATE 10 MG/2ML IJ SOLN: 5.0000 mg | Freq: Four times a day (QID) | INTRAMUSCULAR | Status: DC | PRN

## 2023-07-25 MED ORDER — KETOROLAC TROMETHAMINE 15 MG/ML IJ SOLN
15.0000 mg | Freq: Once | INTRAMUSCULAR | Status: AC
  Administered 2023-07-25: 15 mg via INTRAVENOUS
  Filled 2023-07-25: qty 1

## 2023-07-25 MED ORDER — SENNOSIDES-DOCUSATE SODIUM 8.6-50 MG PO TABS
1.0000 | ORAL_TABLET | Freq: Two times a day (BID) | ORAL | Status: DC
Start: 2023-07-25 — End: 2023-07-26
  Administered 2023-07-25 – 2023-07-26 (×2): 1 via ORAL
  Filled 2023-07-25 (×2): qty 1

## 2023-07-25 MED ORDER — ENOXAPARIN SODIUM 40 MG/0.4ML IJ SOSY: 40.0000 mg | PREFILLED_SYRINGE | INTRAMUSCULAR | Status: DC

## 2023-07-25 MED ORDER — BISACODYL 5 MG PO TBEC: 5.0000 mg | DELAYED_RELEASE_TABLET | Freq: Every day | ORAL | Status: DC | PRN

## 2023-07-25 MED ORDER — SENNOSIDES-DOCUSATE SODIUM 8.6-50 MG PO TABS: 1.0000 | ORAL_TABLET | Freq: Two times a day (BID) | ORAL | Status: DC

## 2023-07-25 MED ORDER — PREDNISONE 5 MG PO TABS: 10.0000 mg | ORAL_TABLET | Freq: Every day | ORAL | Status: DC

## 2023-07-25 MED ORDER — POLYETHYLENE GLYCOL 3350 17 G PO PACK
17.0000 g | PACK | Freq: Two times a day (BID) | ORAL | Status: DC
  Administered 2023-07-25 – 2023-07-26 (×2): 17 g via ORAL
  Filled 2023-07-25 (×2): qty 1

## 2023-07-25 MED ORDER — POLYETHYLENE GLYCOL 3350 17 G PO PACK: 17.0000 g | PACK | Freq: Every day | ORAL | Status: DC | PRN

## 2023-07-25 MED ORDER — OXYCODONE HCL 5 MG PO TABS: 5.0000 mg | ORAL_TABLET | Freq: Four times a day (QID) | ORAL | Status: DC | PRN

## 2023-07-25 MED ORDER — METOPROLOL TARTRATE 12.5 MG HALF TABLET
12.5000 mg | ORAL_TABLET | Freq: Two times a day (BID) | ORAL | Status: DC
  Administered 2023-07-25 – 2023-07-26 (×3): 12.5 mg via ORAL
  Filled 2023-07-25 (×3): qty 1

## 2023-07-25 MED ORDER — VITAMIN C 500 MG PO TABS
500.0000 mg | ORAL_TABLET | Freq: Every day | ORAL | Status: DC
  Administered 2023-07-25 – 2023-07-26 (×2): 500 mg via ORAL
  Filled 2023-07-25 (×2): qty 1

## 2023-07-25 NOTE — Progress Notes (Signed)
 PROGRESS NOTE  Robert Hensley FMW:[id number] DOB: Apr 05, 1941 DOA: 07/24/2023 PCP: Clarice Nottingham, MD  HPI/Recap of past 24 hours: Robert Hensley is a 83 y.o. male with medical history significant for paroxysmal A-fib on Eliquis  status post DCCV in 2019, asymptomatic bilateral carotid artery atherosclerosis, hypertension, hyperlipidemia, COPD, former smoker, ascending aortic aneurysm measuring 4.6 cm, moderate PAD with stable claudication, chronic PVCs, history of prostate cancer for which he underwent radical prostatectomy in 1993, who initially presented to [facility] ED with complaints of abdominal pain and distention, associated with nausea, poor oral intake, constipation no bowel movement since 07/20/23 despite taking stool softeners.  He presented to the ED for further evaluation. In the ED, a CT angio chest abdomen and pelvis revealed diffuse colonic stool with dilatation of the right side of the colon and air-fluid level in the ascending and transverse colon.  EDP discussed the case with general surgery due to concern for possible cecal volvulus.  General surgery reviewed imaging and felt CT does not show this, but does show right-sided constipation.  Incidental COVID-19 screening test returned positive.  Denies any respiratory symptoms except for chronic cough.  Vital signs and labs fairly stable.  Admitted for further management.    Today, patient denies any new complaints.  Reported having a large bowel movement this a.m., feels relief.  Denies any shortness of breath, fever/chills, weakness.  Feels like he can have more bowel movements.  Noted bilateral lower extremity edema.   Assessment/Plan: Principal Problem:   Constipation  Right-sided constipation seen on CT scan Continue bowel regimen Early mobilization. Avoid opiates if able   COVID-19 viral infection, incidental finding COVID-19 screening test + 07/24/2023 Completely asymptomatic CT scan was nonacute    Leukocytosis Suspect reactive in the setting of chronic steroid use for his back pain Afebrile Daily CBC   Physical debility PT OT assessment Fall precautions   Hyperlipidemia Resume home Lipitor   Paroxysmal A-fib on Eliquis  Resume home Eliquis  for CVA prevention Resume home p.o. Lopressor  for rate control Outpatient echo scheduled for 08/02/2023   Hypertension BPs are currently soft Resume home oral antihypertensives as blood pressure allows  Hyponatremia Likely 2/2 Benicar  Daily BMP   Chronic back pain, was on chronic steroids No longer taking opioids or muscle relaxant, self-reported Avoid opiates as able due to constipation.   Pulmonary nodule measuring 5 mm seen on CT scan Appears stable  Obesity Lifestyle modification advised     Estimated body mass index is 32.46 kg/m as calculated from the following:   Height as of this encounter: 6' 1 (1.854 m).   Weight as of this encounter: 111.6 kg.     Code Status: Full  Family Communication: None at bedside  Disposition Plan: Status is: Observation The patient remains OBS appropriate and will d/c before 2 midnights.      Consultants: None  Procedures: None  Antimicrobials: None  DVT prophylaxis: Eliquis    Objective: Vitals:   07/25/23 0258 07/25/23 0450 07/25/23 0959 07/25/23 1400  BP: (!) 140/64 (!) 135/58 122/65 (!) 108/51  Pulse: 79 71 78 67  Resp: 17 17 15 15   Temp:   (!) 97.2 F (36.2 C) (!) 97.5 F (36.4 C)  TempSrc:      SpO2: 98% 100% 100% 100%  Weight:      Height:        Intake/Output Summary (Last 24 hours) at 07/25/2023 1520 Last data filed at 07/25/2023 1403 Gross per 24 hour  Intake 1298.62 ml  Output --  Net 1298.62 ml   Filed Weights   07/24/23 1026  Weight: 111.6 kg    Exam: General: NAD  Cardiovascular: S1, S2 present Respiratory: CTAB Abdomen: Soft, nontender, +distended, bowel sounds present Musculoskeletal: bilateral pedal edema noted Skin:  Normal Psychiatry: Normal mood     Data Reviewed: CBC: Recent Labs  Lab 07/24/23 0930 07/25/23 0954  WBC 13.0* 12.4*  NEUTROABS  --  9.7*  HGB 13.6 13.4  HCT 39.3 39.2  MCV 88.3 90.5  PLT 321 315   Basic Metabolic Panel: Recent Labs  Lab 07/24/23 0930 07/25/23 0954  NA 132* 128*  K 3.9 4.0  CL 94* 93*  CO2 29 28  GLUCOSE 118* 100*  BUN 23 19  CREATININE 0.74 0.56*  CALCIUM  9.3 8.6*   GFR: Estimated Creatinine Clearance: 90 mL/min (A) (by C-G formula based on SCr of 0.56 mg/dL (L)). Liver Function Tests: Recent Labs  Lab 07/24/23 0930  AST 18  ALT 25  ALKPHOS 101  BILITOT 0.8  PROT 6.7  ALBUMIN 4.0   Recent Labs  Lab 07/24/23 0930  LIPASE 20   No results for input(s): AMMONIA in the last 168 hours. Coagulation Profile: Recent Labs  Lab 07/24/23 1120  INR 1.2   Cardiac Enzymes: No results for input(s): CKTOTAL, CKMB, CKMBINDEX, TROPONINI in the last 168 hours. BNP (last 3 results) No results for input(s): PROBNP in the last 8760 hours. HbA1C: No results for input(s): HGBA1C in the last 72 hours. CBG: No results for input(s): GLUCAP in the last 168 hours. Lipid Profile: No results for input(s): CHOL, HDL, LDLCALC, TRIG, CHOLHDL, LDLDIRECT in the last 72 hours. Thyroid Function Tests: No results for input(s): TSH, T4TOTAL, FREET4, T3FREE, THYROIDAB in the last 72 hours. Anemia Panel: No results for input(s): VITAMINB12, FOLATE, FERRITIN, TIBC, IRON, RETICCTPCT in the last 72 hours. Urine analysis:    Component Value Date/Time   COLORURINE YELLOW 07/24/2023 1023   APPEARANCEUR CLEAR 07/24/2023 1023   LABSPEC >1.046 (H) 07/24/2023 1023   PHURINE 7.0 07/24/2023 1023   GLUCOSEU NEGATIVE 07/24/2023 1023   HGBUR NEGATIVE 07/24/2023 1023   BILIRUBINUR NEGATIVE 07/24/2023 1023   KETONESUR NEGATIVE 07/24/2023 1023   PROTEINUR TRACE (A) 07/24/2023 1023   NITRITE NEGATIVE 07/24/2023 1023    LEUKOCYTESUR NEGATIVE 07/24/2023 1023   Sepsis Labs: @LABRCNTIP (procalcitonin:4,lacticidven:4)  ) Recent Results (from the past 240 hours)  Blood Culture (routine x 2)     Status: None (Preliminary result)   Collection Time: 07/24/23  9:49 AM   Specimen: BLOOD LEFT WRIST  Result Value Ref Range Status   Specimen Description   Final    BLOOD LEFT WRIST Performed at [facility], [address] [place], [place] [address]    Special Requests   Final    BOTTLES DRAWN AEROBIC AND ANAEROBIC Blood Culture adequate volume Performed at [facility], [address], [place], [place] [address]    Culture   Final    NO GROWTH < 24 HOURS Performed at [facility], [address] [place], [place] [address]    Report Status PENDING  Incomplete  Blood Culture (routine x 2)     Status: None (Preliminary result)   Collection Time: 07/24/23  9:54 AM   Specimen: BLOOD  Result Value Ref Range Status   Specimen Description   Final    BLOOD LEFT ANTECUBITAL Performed at [facility], [address] [place], [place] [address]    Special Requests   Final  BOTTLES DRAWN AEROBIC AND ANAEROBIC Blood Culture adequate volume Performed at [facility], [address] [place], [place] [address]    Culture   Final    NO GROWTH < 24 HOURS Performed at [facility], [address] [place], [place] [address]    Report Status PENDING  Incomplete  Resp panel by RT-PCR (RSV, Flu A&B, Covid) Anterior Nasal Swab     Status: Abnormal   Collection Time: 07/24/23 10:23 AM   Specimen: Anterior Nasal Swab  Result Value Ref Range Status   SARS Coronavirus 2 by RT PCR POSITIVE (A) NEGATIVE Final    Comment: (NOTE) SARS-CoV-2 target nucleic acids are DETECTED.  The SARS-CoV-2 RNA is generally detectable in upper respiratory specimens during the acute phase of infection. Positive results are indicative of the presence of the  identified virus, but do not rule out bacterial infection or co-infection with other pathogens not detected by the test. Clinical correlation with patient history and other diagnostic information is necessary to determine patient infection status. The expected result is Negative.  Fact Sheet for Patients: bloggercourse.com  Fact Sheet for Healthcare Providers: seriousbroker.it  This test is not yet approved or cleared by the [place]  FDA and  has been authorized for detection and/or diagnosis of SARS-CoV-2 by FDA under an Emergency Use Authorization (EUA).  This EUA will remain in effect (meaning this test can be used) for the duration of  the COVID-19 declaration under Section 564(b)(1) of the A ct, 21 U.S.C. section 360bbb-3(b)(1), unless the authorization is terminated or revoked sooner.     Influenza A by PCR NEGATIVE NEGATIVE Final   Influenza B by PCR NEGATIVE NEGATIVE Final    Comment: (NOTE) The Xpert Xpress SARS-CoV-2/FLU/RSV plus assay is intended as an aid in the diagnosis of influenza from Nasopharyngeal swab specimens and should not be used as a sole basis for treatment. Nasal washings and aspirates are unacceptable for Xpert Xpress SARS-CoV-2/FLU/RSV testing.  Fact Sheet for Patients: bloggercourse.com  Fact Sheet for Healthcare Providers: seriousbroker.it  This test is not yet approved or cleared by the [place]  FDA and has been authorized for detection and/or diagnosis of SARS-CoV-2 by FDA under an Emergency Use Authorization (EUA). This EUA will remain in effect (meaning this test can be used) for the duration of the COVID-19 declaration under Section 564(b)(1) of the Act, 21 U.S.C. section 360bbb-3(b)(1), unless the authorization is terminated or revoked.     Resp Syncytial Virus by PCR NEGATIVE NEGATIVE Final    Comment: (NOTE) Fact Sheet for  Patients: bloggercourse.com  Fact Sheet for Healthcare Providers: seriousbroker.it  This test is not yet approved or cleared by the [place]  FDA and has been authorized for detection and/or diagnosis of SARS-CoV-2 by FDA under an Emergency Use Authorization (EUA). This EUA will remain in effect (meaning this test can be used) for the duration of the COVID-19 declaration under Section 564(b)(1) of the Act, 21 U.S.C. section 360bbb-3(b)(1), unless the authorization is terminated or revoked.  Performed at [facility], [address] [place], [place] [address]       Studies: No results found.  Scheduled Meds:  apixaban   5 mg Oral BID   ascorbic acid   500 mg Oral Daily   atorvastatin   40 mg Oral Daily   metoprolol  tartrate  12.5 mg Oral BID    Continuous Infusions:   LOS: 0 days     Robert JINNY Cage, MD [facility]  If 7PM-7AM, please contact  night-coverage [email or web address] 07/25/2023, 3:20 PM

## 2023-07-25 NOTE — H&P (Addendum)
 History and Physical  Robert Hensley FMW:[id number] DOB: [birth year] DOA: 07/24/2023  Referring physician: Accepted by Dr. Regalado, [facility], hospitalist service. PCP: Clarice Nottingham, MD  Outpatient Specialists: Cardiology. Patient coming from: Home via [facility].  Chief Complaint: Abdominal pain.  HPI: Robert Hensley is a 83 y.o. male with medical history significant for paroxysmal A-fib on Eliquis  status post DCCV in 2019, asymptomatic bilateral carotid artery atherosclerosis, hypertension, hyperlipidemia, COPD, former smoker, ascending aortic aneurysm measuring 4.6 cm, moderate PAD with stable claudication, chronic PVCs, history of prostate cancer for which he underwent radical prostatectomy in 1993, who initially presented to [facility] ED with complaints of abdominal pain and distention.  Associated with nausea, poor oral intake, constipation no bowel movement since 07/20/23 despite taking stool softeners.  He presented to the ED for further evaluation.  In the ED, a CT angio chest abdomen and pelvis with and without contrast revealed diffuse colonic stool with dilatation of the right side of the colon and air-fluid level in the ascending and transverse colon.  EDP discussed the case with general surgery due to concern for possible cecal volvulus.  General surgery reviewed imaging and felt CT does not show this, but does show right-sided constipation.  COVID-19 screening test returned positive.  Due to concern for COVID-19 viral infection, [facility] was asked to admit.  Accepted by Dr. Regalado, [facility], Hospital service, and transferred to [facility] telemetry unit as observation status.  At the time of this visit, the patient denies any upper respiratory infection symptoms.  States he has an occasional cough which is chronic.  Denies any anginal symptoms.  ED Course: Temperature 97.9.  BP 132/59, pulse 91, respiration rate 18, O2 saturation 97% on room air.  Lab studies notable for WBC 13.0.   Serum sodium 132, glucose 118.  Lactic acid 1.1.  UA negative for pyuria.  Review of Systems: Review of systems as noted in the HPI. All other systems reviewed and are negative.   Past Medical History:  Diagnosis Date   A-fib (HCC)    Blood transfusion    Complication of anesthesia    Diverticulitis    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HCC)    GI bleed    Hyperlipidemia    Hypertension    Pneumonia    Prostate cancer [facility])    Past Surgical History:  Procedure Laterality Date   ABDOMINAL AORTOGRAM W/LOWER EXTREMITY Bilateral 11/22/2021   Procedure: ABDOMINAL AORTOGRAM W/LOWER EXTREMITY;  Surgeon: Ladona Heinz, MD;  Location: [facility] INVASIVE CV LAB;  Service: Cardiovascular;  Laterality: Bilateral;   CARDIOVERSION N/A 09/11/2017   Procedure: CARDIOVERSION;  Surgeon: Elmira Newman PARAS, MD;  Location: [facility] ENDOSCOPY;  Service: Cardiovascular;  Laterality: N/A;   CATARACT EXTRACTION     cyst removed from back     cyst removed from right knee     LEFT HEART CATH AND CORONARY ANGIOGRAPHY N/A 08/28/2017   Procedure: LEFT HEART CATH AND CORONARY ANGIOGRAPHY;  Surgeon: Elmira Newman PARAS, MD;  Location: [facility] INVASIVE CV LAB;  Service: Cardiovascular;  Laterality: N/A;   PROSTATECTOMY     TESTICLE REMOVAL     Right   TUMOR REMOVAL  07/24/1974   Between lung and chest wall    Social History:  reports that he quit smoking about 48 years ago. His smoking use included cigarettes. He started smoking about 73 years ago. He has a 25 pack-year smoking history. He quit smokeless tobacco use about 29 years ago.  His smokeless tobacco use included chew. He reports  that he does not drink alcohol and does not use drugs.   Allergies  Allergen Reactions   Penicillins Anaphylaxis and Other (See Comments)    throat swelling with synthetic penicillins Has patient had a PCN reaction causing immediate rash, facial/tongue/throat swelling, SOB or lightheadedness with hypotension: No Has patient had a PCN reaction causing  severe rash involving mucus membranes or skin necrosis: No Has patient had a PCN reaction that required hospitalization: Yes Has patient had a PCN reaction occurring within the last 10 years: No If all of the above answers are NO, then may proceed with Cephalosporin use.    Benzocaine Other (See Comments)    Hypotensive -Pt unaware of this allergy    Other Other (See Comments)    General Anesthesia - BP drops     Family History  Problem Relation Age of Onset   Liver cancer Sister    Cancer Brother    Prostate cancer Brother    Heart failure Maternal Aunt    Hypertension Maternal Aunt       Prior to Admission medications   Medication Sig Start Date End Date Taking? Authorizing Provider  amLODipine (NORVASC) 10 MG tablet Take 10 mg by mouth daily.   Yes [provider]  atorvastatin  (LIPITOR) 40 MG tablet Take 40 mg by mouth daily.   Yes [provider]  B Complex Vitamins (B COMPLEX-B12 PO) Take 1 tablet by mouth daily.   Yes [provider]  cyclobenzaprine (FLEXERIL) 5 MG tablet Take 5-10 mg by mouth at bedtime as needed. 07/19/23  Yes [provider]  diphenhydramine-acetaminophen  (TYLENOL  PM) 25-500 MG TABS tablet Take 1 tablet by mouth at bedtime.   Yes [provider]  ELIQUIS  5 MG TABS tablet TAKE 1 TABLET BY MOUTH TWICE  DAILY 10/23/22  Yes Ladona Heinz, MD  melatonin 5 MG TABS Take 5 mg by mouth at bedtime. Take on extra if needed   Yes [provider]  metoprolol  tartrate (LOPRESSOR ) 25 MG tablet Take 12.5 mg by mouth 2 (two) times daily.    Yes [provider]  Multiple Vitamin (MULTIVITAMIN) tablet Take 1 tablet by mouth daily.   Yes [provider]  Multiple Vitamins-Minerals (EQ VISION FORMULA 50+ PO) Take 1 capsule by mouth daily.   Yes [provider]  olmesartan -hydrochlorothiazide (BENICAR  HCT) 40-25 MG tablet TAKE 1 TABLET BY MOUTH IN THE  MORNING 03/16/23  Yes Ladona Heinz, MD  predniSONE   (DELTASONE ) 10 MG tablet Take by mouth. Taper dose 07/19/23  Yes [provider]  traMADol (ULTRAM) 50 MG tablet Take 50-100 mg by mouth 3 (three) times daily. *may use with tylenol  maximum of 3000 mg per day* 07/19/23  Yes [provider]  vitamin C  (ASCORBIC ACID ) 500 MG tablet Take 500 mg by mouth daily.    Yes [provider]  amiodarone  (PACERONE ) 200 MG tablet TAKE ONE-HALF TABLET BY MOUTH  DAILY Patient taking differently: Take 100 mg by mouth every other day. 04/17/23   Ladona Heinz, MD    Physical Exam: BP (!) 132/59 (BP Location: Right Arm)   Pulse 91   Temp 97.9 F (36.6 C)   Resp 18   Ht 6' 1 (1.854 m)   Wt 111.6 kg   SpO2 97%   BMI 32.46 kg/m   General: 83 y.o. year-old male well developed well nourished in no acute distress.  Alert and oriented x3. Cardiovascular: Regular rate and rhythm with no rubs or gallops.  No thyromegaly  or JVD noted.  Trace lower extremity edema bilaterally. Respiratory: Clear to auscultation with no wheezes or rales. Good inspiratory effort. Abdomen: Distended with hypoactive bowel sounds x4 quadrants. Muskuloskeletal: No cyanosis, clubbing or edema noted bilaterally Neuro: CN II-XII intact, strength, sensation, reflexes Skin: No ulcerative lesions noted or rashes Psychiatry: Judgement and insight appear normal. Mood is appropriate for condition and setting          Labs on Admission:  Basic Metabolic Panel: Recent Labs  Lab 07/24/23 0930  NA 132*  K 3.9  CL 94*  CO2 29  GLUCOSE 118*  BUN 23  CREATININE 0.74  CALCIUM  9.3   Liver Function Tests: Recent Labs  Lab 07/24/23 0930  AST 18  ALT 25  ALKPHOS 101  BILITOT 0.8  PROT 6.7  ALBUMIN 4.0   Recent Labs  Lab 07/24/23 0930  LIPASE 20   No results for input(s): AMMONIA in the last 168 hours. CBC: Recent Labs  Lab 07/24/23 0930  WBC 13.0*  HGB 13.6  HCT 39.3  MCV 88.3  PLT 321   Cardiac Enzymes: No results for input(s): CKTOTAL,  CKMB, CKMBINDEX, TROPONINI in the last 168 hours.  BNP (last 3 results) No results for input(s): BNP in the last 8760 hours.  ProBNP (last 3 results) No results for input(s): PROBNP in the last 8760 hours.  CBG: No results for input(s): GLUCAP in the last 168 hours.  Radiological Exams on Admission: CT Angio Chest/Abd/Pel for Dissection W and/or Wo Contrast Result Date: 07/24/2023 CLINICAL DATA:  Aortic aneurysm. EXAM: CT ANGIOGRAPHY CHEST, ABDOMEN AND PELVIS TECHNIQUE: Non-contrast CT of the chest was initially obtained. Multidetector CT imaging through the chest, abdomen and pelvis was performed using the standard protocol during bolus administration of intravenous contrast. Multiplanar reconstructed images and MIPs were obtained and reviewed to evaluate the vascular anatomy. RADIATION DOSE REDUCTION: This exam was performed according to the departmental dose-optimization program which includes automated exposure control, adjustment of the mA and/or kV according to patient size and/or use of iterative reconstruction technique. CONTRAST:  OMNIPAQUE  IOHEXOL  350 MG/ML SOLN COMPARISON:  CTA 11/08/2022. Older chest CT scans. Abdomen pelvis CT 2013. FINDINGS: CTA CHEST FINDINGS Cardiovascular: No curvilinear bright density along the course of the thoracic aorta on the noncontrast dataset. The thoracic aorta has a scattered calcified atherosclerotic plaque. Diameter of the ascending aorta at the level of the right pulmonary artery measures 4.3 by 4.4 cm. The descending thoracic aorta at the same level measures 2.7 by 2.8 cm. Noncalcified plaque also seen. Diameter of the aortic root measures 3.7 cm. Diameter of the distal aortic arch approaches 3 cm. Great vessels are grossly patent with some scattered calcified plaque. There is separate origin of the left vertebral artery directly from the aortic arch proximal to the origin of the left subclavian. Heart is nonenlarged. No significant  pericardial effusion. Slight prominence of the main pulmonary artery. Please correlate for any evidence of pulmonary artery hypertension. Mediastinum/Nodes: Normal caliber thoracic esophagus is slightly patulous. Small thyroid gland. No specific abnormal lymph node enlargement identified in the axillary regions, hilum or mediastinum. Lungs/Pleura: Calcified pleural plaques seen scattered throughout the left hemithorax with some volume loss and noncalcified pleural thickening. Mild calcified plaque along the right side. No significant pleural effusion. Areas of scarring and fibrotic changes along both lungs, left-greater-than-right. There is also some what may be mild rounded atelectasis along the superior segment of the left lower lobe. Appearance is similar to previous examination. No pneumothorax. No  new consolidation. Stable noncalcified tiny right lower lobe lung nodule measuring 5 mm on series 8, image 128. Unchanged from September 2022 demonstrating long-term stability. No additional imaging follow-up. Musculoskeletal: Diffuse bridging syndesmophytes and osteophytes along the spine. Osteopenia. Curvature of the thoracic spine. Old left-sided rib fractures. Degenerative changes along the right sternoclavicular joint with sclerosis. Review of the MIP images confirms the above findings. CTA ABDOMEN AND PELVIS FINDINGS VASCULAR Aorta: Severe diffuse calcified plaque without aneurysm formation or dissection. Celiac: Severe ostial stenosis best seen on sagittal images. Slight poststenotic dilatation. SMA: Moderate calcified plaque at the origin with moderate to severe stenosis of the origin. Replaced hepatic artery from the SMA. Renals: 2 left single right renal arteries. Moderate calcified plaque. IMA: Grossly patent Inflow: Scattered calcified plaque along the iliac vessels diffusely. Mild areas of stenosis along the iliac vessels. There is more moderate stenosis related to calcified plaque along the right common  femoral artery focally. Please correlate with symptoms. Veins: No obvious venous abnormality within the limitations of this arterial phase study. Review of the MIP images confirms the above findings. NON-VASCULAR Hepatobiliary: With the limits of the early phase of the arterial bolus, grossly the liver is preserved. Gallbladder is present. Pancreas: Unremarkable. No pancreatic ductal dilatation or surrounding inflammatory changes. Spleen: Normal in size without focal abnormality. Adrenals/Urinary Tract: Right adrenal gland is preserved. Left adrenal gland is diffusely thickened and nodular. Unchanged from previous examinations. No enhancing renal mass. The ureters have normal course and caliber extending down to the urinary bladder. Preserved contours of the urinary bladder. Stomach/Bowel: No oral contrast. Large bowel has a redundant course. Significant colonic stool diffusely. There is ectasia of the cecum measuring up to 10 cm. Luminal air and fluid along the colon particularly the transverse colon. Small bowel is nondilated. Lymphatic: No specific abnormal lymph node enlargement in the abdomen pelvis. Reproductive: Surgical changes along the prostate bed. Other: No free air or free fluid. Musculoskeletal: Degenerative changes of the spine and pelvis. Review of the MIP images confirms the above findings. IMPRESSION: Extensive vascular calcifications. No dissection identified. Mild ectasia once again of the ascending aorta up to 4.4 cm. Recommend annual imaging followup by CTA or MRA. This recommendation follows 2010 ACCF/AHA/AATS/ACR/ASA/SCA/SCAI/SIR/STS/SVM Guidelines for the Diagnosis and Management of Patients with Thoracic Aortic Disease. Circulation. 2010; 121: Z733-z630. Aortic aneurysm NOS (ICD10-I71.9) Significant plaque along the mesenteric vessels with significant stenosis of the celiac and SMA. Please correlate with any symptoms. Moderate stenosis of the right common femoral artery related to calcified  plaque. Diffuse colonic stool with dilatation of the right side of the colon and air-fluid level in the ascending and transverse colon. Please correlate with symptoms. No free air or inflammatory changes. Calcified pleural plaquing.  Areas of scarring and fibrotic changes. Electronically Signed   By: Ranell Bring M.D.   On: 07/24/2023 12:41    EKG: I independently viewed the EKG done and my findings are as followed: Sinus rhythm rate of 74.  Nonspecific ST-T changes.  QTc 410.  Assessment/Plan Present on Admission:  Constipation  Principal Problem:   Constipation  Right-sided constipation seen on CT scan Bowel regimen in place Milk of magnesia 15 mls x2 IV fluid hydration Replace electrolytes as indicated Early mobilization. Avoid opiates if able  COVID-19 viral infection, incidental finding COVID-19 screening test + 07/24/2023 Denies having any symptoms from the virus CT scan was nonacute  Leukocytosis Suspect reactive in the setting of chronic steroid use Afebrile Monitor for now  Physical debility  PT OT assessment Fall precautions  Hyperlipidemia Resume home Lipitor  Paroxysmal A-fib on Eliquis  Resume home Eliquis  for CVA prevention Resume home p.o. Lopressor  for rate control  Hypertension BPs are currently soft Resume home oral antihypertensives as blood pressure allows.  Chronic back pain, no longer on chronic steroids No longer taking opioids or muscle relaxant, self-reported Avoid opiates as able due to constipation.  Pulmonary nodule measuring 5 mm seen on CT scan Appears stable   Time: 75 minutes.   DVT prophylaxis: Home Eliquis   Code Status: Full code  Family Communication: None at bedside.  Disposition Plan: Admitted to telemetry unit  Consults called: General surgery.  Admission status: Observation status   Status is: Observation    Terry LOISE Hurst MD [facility] Pager [phone]  If 7PM-7AM, please contact  night-coverage [email or web address] Password [email or web address]  07/25/2023, 2:23 AM

## 2023-07-25 NOTE — Evaluation (Signed)
 Physical Therapy Evaluation Patient Details Name: Robert Hensley MRN: [id number] DOB: September 10, 1940 Today's Date: 07/25/2023  History of Present Illness  83 y.o. male who initially presented to [facility] ED with complaints of abdominal pain and distention. pt admitted for Covid and constipation.    PMH:  paroxysmal A-fib, status post DCCV in 2019, asymptomatic bilateral carotid artery atherosclerosis, hypertension, hyperlipidemia, COPD, former smoker, ascending aortic aneurysm measuring 4.6 cm, moderate PAD with stable claudication, chronic PVCs, history of prostate cancer s/p radical prostatectomy in 1993,  Clinical Impression  Pt admitted with above diagnosis.  Pt amb 48' with HHA, denies dyspnea, SpO2=97% on RA, HR in 70s-80s Pt does ascend/descend flight of stairs to basement regularly, will follow in acute setting however do not anticipate need for f/u PT post acute   Pt currently with functional limitations due to the deficits listed below (see PT Problem List). Pt will benefit from acute skilled PT to increase their independence and safety with mobility to allow discharge.           If plan is discharge home, recommend the following:     Can travel by private vehicle        Equipment Recommendations None recommended by PT  Recommendations for Other Services       Functional Status Assessment Patient has had a recent decline in their functional status and demonstrates the ability to make significant improvements in function in a reasonable and predictable amount of time.     Precautions / Restrictions Restrictions Weight Bearing Restrictions Per Provider Order: No      Mobility  Bed Mobility Overal bed mobility: Needs Assistance Bed Mobility: Supine to Sit     Supine to sit: Min assist     General bed mobility comments: very light assist to elevate trunk. incr time. pt reports he pulls up on washstand to sit EOB at home    Transfers Overall transfer level: Needs  assistance Equipment used: None Transfers: Sit to/from Stand, Bed to chair/wheelchair/BSC Sit to Stand: Supervision, Contact guard assist   Step pivot transfers: Supervision       General transfer comment: for safety    Ambulation/Gait Ambulation/Gait assistance: Contact guard assist Gait Distance (Feet): 60 Feet Assistive device: 1 person hand held assist, None Gait Pattern/deviations: Step-through pattern       General Gait Details: intermittent unilateral HHA, light steadyign assist, no overt LOB  Stairs            Wheelchair Mobility     Tilt Bed    Modified Rankin (Stroke Patients Only)       Balance Overall balance assessment: Needs assistance Sitting-balance support: No upper extremity supported, Feet supported Sitting balance-Leahy Scale: Good     Standing balance support: During functional activity, Single extremity supported Standing balance-Leahy Scale: Fair                               Pertinent Vitals/Pain Pain Assessment Pain Assessment: 0-10 Pain Score: 2  Pain Location: back (chronic) Pain Descriptors / Indicators: Discomfort Pain Intervention(s): Monitored during session, Repositioned    Home Living Family/patient expects to be discharged to:: Private residence Living Arrangements: Alone   Type of Home: House Home Access: Stairs to enter   Secretary/administrator of Steps: 2 Alternate Level Stairs-Number of Steps: flight Home Layout: Two level;Laundry or work area in basement   Additional Comments: volunteers with [employer] 3x/wk    Prior Function  Prior Level of Function : Independent/Modified Independent;Driving             Mobility Comments: pt reports independence, community ambulator, no  device ADLs Comments: reports independence, does his own laundry (in the basement)     Extremity/Trunk Assessment   Upper Extremity Assessment Upper Extremity Assessment: Defer to OT evaluation    Lower  Extremity Assessment Lower Extremity Assessment: Overall WFL for tasks assessed       Communication   Communication Communication: No apparent difficulties  Cognition Arousal: Alert Behavior During Therapy: WFL for tasks assessed/performed Overall Cognitive Status: Within Functional Limits for tasks assessed                                          General Comments      Exercises     Assessment/Plan    PT Assessment Patient needs continued PT services  PT Problem List Decreased mobility;Decreased activity tolerance       PT Treatment Interventions Gait training;Stair training;Functional mobility training;Therapeutic activities;Therapeutic exercise    PT Goals (Current goals can be found in the Care Plan section)  Acute Rehab PT Goals PT Goal Formulation: With patient Time For Goal Achievement: 08/08/23 Potential to Achieve Goals: Good    Frequency Min 1X/week     Co-evaluation               AM-PAC PT 6 Clicks Mobility  Outcome Measure Help needed turning from your back to your side while in a flat bed without using bedrails?: A Little Help needed moving from lying on your back to sitting on the side of a flat bed without using bedrails?: A Little Help needed moving to and from a bed to a chair (including a wheelchair)?: A Little Help needed standing up from a chair using your arms (e.g., wheelchair or bedside chair)?: A Little Help needed to walk in hospital room?: A Little Help needed climbing 3-5 steps with a railing? : A Little 6 Click Score: 18    End of Session Equipment Utilized During Treatment: Gait belt Activity Tolerance: Patient tolerated treatment well Patient left: in chair;with call bell/phone within reach;with chair alarm set   PT Visit Diagnosis: Other abnormalities of gait and mobility (R26.89)    Time: 9077-9058 PT Time Calculation (min) (ACUTE ONLY): 19 min   Charges:   PT Evaluation $PT Eval Low Complexity: 1  Low   PT General Charges $$ ACUTE PT VISIT: 1 Visit         Ashantee Deupree, PT  Acute Rehab Dept [facility]) [phone]  07/25/2023   [facility] 07/25/2023, 9:57 AM

## 2023-07-25 NOTE — Evaluation (Signed)
 Occupational Therapy Evaluation Patient Details Name: Robert Hensley MRN: [id number] DOB: 20-Sep-1940 Today's Date: 07/25/2023   History of Present Illness Patient is a 83 year old male who presented on 12/31 with poor oral intake, constipation and nausea. Patient was found to have right sided constipation, and COVID 19. PMH: a fib,chronic back pain, GI bleed, prostate cancer, cardioversion, HTN, emphysematous COPD.   Clinical Impression   Patient evaluated by Occupational Therapy with no further acute OT needs identified. All education has been completed and the patient has no further questions. Patient is at baseline for ADLs at this time. See below for any follow-up Occupational Therapy or equipment needs. OT is signing off. Thank you for this referral.        If plan is discharge home, recommend the following: Assistance with cooking/housework    Functional Status Assessment  Patient has not had a recent decline in their functional status  Equipment Recommendations  None recommended by OT       Precautions / Restrictions Restrictions Weight Bearing Restrictions Per Provider Order: No         Balance Overall balance assessment: Mild deficits observed, not formally tested                                         ADL either performed or assessed with clinical judgement   ADL Overall ADL's : At baseline         General ADL Comments: patient is currently supervision for ADls in room with patient reaching out to hold onto objects in room. patient reported he does not do this at home but that he feels better today and is doing it less. patient was able to complete figure four bilaterally sitting in recliner to don/doff socks with no A. patient endorsed that he is at his baseline for ADLs at this time.     Vision Baseline Vision/History: 1 Wears glasses              Pertinent Vitals/Pain Pain Assessment Pain Assessment: 0-10 Pain Score: 2  Pain Location:  back (chronic) Pain Descriptors / Indicators: Discomfort Pain Intervention(s): Limited activity within patient's tolerance, Monitored during session     Extremity/Trunk Assessment Upper Extremity Assessment Upper Extremity Assessment: LUE deficits/detail LUE Deficits / Details: h/o rotator cuff tear from fall in 2010 per patient report. able to FF about 60 degrees.   Lower Extremity Assessment Lower Extremity Assessment: Defer to PT evaluation       Communication     Cognition Arousal: Alert Behavior During Therapy: [facility] for tasks assessed/performed Overall Cognitive Status: Within Functional Limits for tasks assessed                     Home Living Family/patient expects to be discharged to:: Private residence Living Arrangements: Alone   Type of Home: House Home Access: Stairs to enter [employer] of Steps: 2   Home Layout: Two level;Laundry or work area in basement Alternate Teacher, Music of Steps: flight Alternate Level Stairs-Rails: Right               Additional Comments: volunteers with [employer] 3x/wk      Prior Functioning/Environment Prior Level of Function : Independent/Modified Independent;Driving             Mobility Comments: pt reports independence, community ambulator, no  device ADLs Comments: reports independence, does his  own laundry (in the basement)        OT Problem List:        OT Treatment/Interventions:      OT Goals(Current goals can be found in the care plan section) Acute Rehab OT Goals Patient Stated Goal: to go home OT Goal Formulation: All assessment and education complete, DC therapy  OT Frequency:         AM-PAC OT 6 Clicks Daily Activity     Outcome Measure Help from another person eating meals?: None Help from another person taking care of personal grooming?: None Help from another person toileting, which includes using toliet, bedpan, or urinal?: A Little (supervision) Help from  another person bathing (including washing, rinsing, drying)?: A Little Help from another person to put on and taking off regular upper body clothing?: None Help from another person to put on and taking off regular lower body clothing?: None 6 Click Score: 22   End of Session Nurse Communication: Mobility status  Activity Tolerance: Patient tolerated treatment well Patient left: in chair;with call bell/phone within reach;with chair alarm set  OT Visit Diagnosis: Unsteadiness on feet (R26.81)                Time: 8566-8557 OT Time Calculation (min): 9 min Charges:  OT General Charges $OT Visit: 1 Visit OT Evaluation $OT Eval Low Complexity: 1 Low  Hank Walling OTR/L, MS Acute Rehabilitation Department Office# [phone]   Geofm CHRISTELLA Dance 07/25/2023, 3:18 PM

## 2023-07-26 DIAGNOSIS — K5900 Constipation, unspecified: Secondary | ICD-10-CM | POA: Diagnosis not present

## 2023-07-26 LAB — CBC WITH DIFFERENTIAL/PLATELET
Abs Immature Granulocytes: 0.05 10*3/uL (ref 0.00–0.07)
Basophils Absolute: 0 10*3/uL (ref 0.0–0.1)
Basophils Relative: 0 %
Eosinophils Absolute: 0.1 10*3/uL (ref 0.0–0.5)
Eosinophils Relative: 1 %
HCT: 37.9 % — ABNORMAL LOW (ref 39.0–52.0)
Hemoglobin: 12.7 g/dL — ABNORMAL LOW (ref 13.0–17.0)
Immature Granulocytes: 0 %
Lymphocytes Relative: 19 %
Lymphs Abs: 2.2 10*3/uL (ref 0.7–4.0)
MCH: 30.9 pg (ref 26.0–34.0)
MCHC: 33.5 g/dL (ref 30.0–36.0)
MCV: 92.2 fL (ref 80.0–100.0)
Monocytes Absolute: 0.7 10*3/uL (ref 0.1–1.0)
Monocytes Relative: 6 %
Neutro Abs: 8.5 10*3/uL — ABNORMAL HIGH (ref 1.7–7.7)
Neutrophils Relative %: 74 %
Platelets: 267 10*3/uL (ref 150–400)
RBC: 4.11 MIL/uL — ABNORMAL LOW (ref 4.22–5.81)
RDW: 13.9 % (ref 11.5–15.5)
WBC: 11.6 10*3/uL — ABNORMAL HIGH (ref 4.0–10.5)
nRBC: 0 % (ref 0.0–0.2)

## 2023-07-26 LAB — BASIC METABOLIC PANEL
Anion gap: 8 (ref 5–15)
BUN: 16 mg/dL (ref 8–23)
CO2: 26 mmol/L (ref 22–32)
Calcium: 8.7 mg/dL — ABNORMAL LOW (ref 8.9–10.3)
Chloride: 97 mmol/L — ABNORMAL LOW (ref 98–111)
Creatinine, Ser: 0.54 mg/dL — ABNORMAL LOW (ref 0.61–1.24)
GFR, Estimated: >60 mL/min (ref >60)
Glucose, Bld: 102 mg/dL — ABNORMAL HIGH (ref 70–99)
Potassium: 4 mmol/L (ref 3.5–5.1)
Sodium: 131 mmol/L — ABNORMAL LOW (ref 135–145)

## 2023-07-26 MED ORDER — POLYETHYLENE GLYCOL 3350 17 G PO PACK: 17.0000 g | PACK | Freq: Every day | ORAL | Status: AC

## 2023-07-26 MED ORDER — SENNOSIDES-DOCUSATE SODIUM 8.6-50 MG PO TABS: 1.0000 | ORAL_TABLET | Freq: Every day | ORAL | Status: AC

## 2023-07-26 NOTE — Care Management Obs Status (Signed)
 MEDICARE OBSERVATION STATUS NOTIFICATION   Patient Details  Name: Robert Hensley MRN: [id number] Date of Birth: 1940/07/27   Medicare Observation Status Notification Given:  Hart Robinsons, LCSW 07/26/2023, 10:41 AM

## 2023-07-26 NOTE — Progress Notes (Signed)
   07/26/23 1042  TOC Brief Assessment  Insurance and Status Reviewed  Patient has primary care physician Yes  Home environment has been reviewed lives alone  Prior level of function: independent  Prior/Current Home Services No current home services  Social Drivers of Health Review SDOH reviewed no interventions necessary  Readmission risk has been reviewed Yes  Transition of care needs no transition of care needs at this time

## 2023-07-26 NOTE — Plan of Care (Signed)
  Problem: Education: Goal: Knowledge of risk factors and measures for prevention of condition will improve Outcome: Progressing   Problem: Education: Goal: Knowledge of General Education information will improve Description: Including pain rating scale, medication(s)/side effects and non-pharmacologic comfort measures Outcome: Progressing   Problem: Activity: Goal: Risk for activity intolerance will decrease Outcome: Progressing   Problem: Skin Integrity: Goal: Risk for impaired skin integrity will decrease Outcome: Progressing

## 2023-07-26 NOTE — Progress Notes (Signed)
 Mobility Specialist - Progress Note   07/26/23 0958  Oxygen Therapy  SpO2 99 %  O2 Device Room Air  Patient Activity (if Appropriate) Ambulating  Mobility  Activity Ambulated independently in hallway  Level of Assistance Independent  Assistive Device None  Distance Ambulated (ft) 160 ft  Activity Response Tolerated well  Mobility Referral Yes  Mobility visit 1 Mobility  Mobility Specialist Start Time (ACUTE ONLY) [id number]  Mobility Specialist Stop Time (ACUTE ONLY) 0941  Mobility Specialist Time Calculation (min) (ACUTE ONLY) 18 min   Nurse requested Mobility Specialist to perform oxygen saturation test with pt which includes removing pt from oxygen both at rest and while ambulating.  Below are the results from that testing.     Patient Saturations on Room Air at Rest = spO2 99%  Patient Saturations on Room Air while Ambulating = sp02 99% .   At end of testing pt left in room on 0  Liters of oxygen.  Reported results to nurse.  Pt received in recliner and agreeable to mobility. No complaints during session. Pt to bathroom after session with all needs met.    Pre-mobility: 99% SpO2 During mobility: 99% SpO2 Post-mobility: 98% SPO2  Chief Technology Officer

## 2023-07-26 NOTE — Discharge Summary (Signed)
 Physician Discharge Summary   Patient: Robert Hensley MRN: [id number] DOB: [birth year]  Admit date:     07/24/2023  Discharge date: 07/26/23  Discharge Physician: Lebron JINNY Cage   PCP: Clarice Nottingham, MD   Recommendations at discharge:   Follow-up with PCP in 1 week Follow-up with cardiology as scheduled on 08/02/2023  Discharge Diagnoses: Principal Problem:   Constipation    Hospital Course: Robert Hensley is a 83 y.o. male with medical history significant for paroxysmal A-fib on Eliquis  status post DCCV in 2019, asymptomatic bilateral carotid artery atherosclerosis, hypertension, hyperlipidemia, COPD, former smoker, ascending aortic aneurysm measuring 4.6 cm, moderate PAD with stable claudication, chronic PVCs, history of prostate cancer for which he underwent radical prostatectomy in 1993, who initially presented to [facility] ED with complaints of abdominal pain and distention, associated with nausea, poor oral intake, constipation no bowel movement since 07/20/23 despite taking stool softeners.  He presented to the ED for further evaluation. In the ED, a CT angio chest abdomen and pelvis revealed diffuse colonic stool with dilatation of the right side of the colon and air-fluid level in the ascending and transverse colon.  Also noted significant plaque along the mesenteric vessels with significant stenosis of the celiac and SMA.  EDP discussed the case with general surgery due to concern for possible cecal volvulus.  General surgery reviewed imaging and felt CT does not show this, but does show right-sided constipation.  Incidental COVID-19 screening test returned positive.  Denies any respiratory symptoms except for chronic cough.  Vital signs and labs fairly stable.  Admitted for further management.     Today, patient denies any new complaints, reports feeling better overall, abdomen not distended and soft, denies any abdominal pain, shortness of breath, chest pain, nausea/vomiting,  fever/chills.  Patient was able to tolerate his diet without any issues, ambulate the hallway saturating 99% on room air.  Advised patient on the need for regular stool softeners/laxatives given significant constipation, verbalized understanding.    Assessment and Plan:  Right-sided constipation seen on CT scan Continue bowel regimen as discussed with patient, has had multiple large stools throughout this admission Mobilize frequently   COVID-19 viral infection, incidental finding COVID-19 screening test + 07/24/2023 Completely asymptomatic, no treatments given CT scan was nonacute   Leukocytosis Suspect reactive in the setting of chronic steroid use for his back pain/significant constipation Afebrile Follow-up with PCP   Physical debility PT OT assessment Fall precautions   Hyperlipidemia Resume home Lipitor   Paroxysmal A-fib on Eliquis  Resume home Eliquis  for CVA prevention Resume home p.o. Lopressor  for rate control Outpatient echo scheduled for 08/02/2023   Hypertension BPs are currently soft Resume home oral antihypertensives   Hyponatremia Likely 2/2 Benicar  Follow-up with PCP  History of peripheral vascular disease History of claudication Noted significant plaque along the mesenteric vessels with significant stenosis of the celiac and SMA on CT done this admission Follows with cardiology/PCP, may need vascular surgery outpatient consult if disease is progressive   Chronic back pain, was on chronic steroids No longer taking opioids or muscle relaxant, self-reported Avoid opiates as able due to constipation.   Pulmonary nodule measuring 5 mm seen on CT scan Appears stable   Obesity Lifestyle modification advised     Consultants: None Procedures performed: None Disposition: Home Diet recommendation:  Cardiac diet    DISCHARGE MEDICATION: Allergies as of 07/26/2023       Reactions   Penicillins Anaphylaxis, Other (See Comments)   throat swelling  with  synthetic penicillins Has patient had a PCN reaction causing immediate rash, facial/tongue/throat swelling, SOB or lightheadedness with hypotension: No Has patient had a PCN reaction causing severe rash involving mucus membranes or skin necrosis: No Has patient had a PCN reaction that required hospitalization: Yes Has patient had a PCN reaction occurring within the last 10 years: No If all of the above answers are NO, then may proceed with Cephalosporin use.   Benzocaine Other (See Comments)   Hypotensive -Pt unaware of this allergy    Other Other (See Comments)   General Anesthesia - BP drops         Medication List     TAKE these medications    amiodarone  200 MG tablet Commonly known as: PACERONE  TAKE ONE-HALF TABLET BY MOUTH  DAILY   amLODipine 10 MG tablet Commonly known as: NORVASC Take 10 mg by mouth daily.   ascorbic acid  500 MG tablet Commonly known as: VITAMIN C  Take 500 mg by mouth daily.   atorvastatin  40 MG tablet Commonly known as: LIPITOR Take 40 mg by mouth daily.   B COMPLEX-B12 PO Take 1 tablet by mouth daily.   cyclobenzaprine 5 MG tablet Commonly known as: FLEXERIL Take 5-10 mg by mouth at bedtime as needed.   diphenhydramine-acetaminophen  25-500 MG Tabs tablet Commonly known as: TYLENOL  PM Take 1 tablet by mouth at bedtime.   Eliquis  5 MG Tabs tablet Generic drug: apixaban  TAKE 1 TABLET BY MOUTH TWICE  DAILY   EQ VISION FORMULA 50+ PO Take 1 capsule by mouth daily.   melatonin 5 MG Tabs Take 5 mg by mouth at bedtime. Take on extra if needed   metoprolol  tartrate 25 MG tablet Commonly known as: LOPRESSOR  Take 12.5 mg by mouth 2 (two) times daily.   multivitamin tablet Take 1 tablet by mouth daily.   olmesartan -hydrochlorothiazide 40-25 MG tablet Commonly known as: BENICAR  HCT TAKE 1 TABLET BY MOUTH IN THE  MORNING   polyethylene glycol 17 g packet Commonly known as: MIRALAX  / GLYCOLAX  Take 17 g by mouth daily.    predniSONE  10 MG tablet Commonly known as: DELTASONE  Take by mouth. Taper dose   senna-docusate 8.6-50 MG tablet Commonly known as: Senokot-S Take 1 tablet by mouth at bedtime.   traMADol 50 MG tablet Commonly known as: ULTRAM Take 50-100 mg by mouth 3 (three) times daily. *may use with tylenol  maximum of 3000 mg per day*        Follow-up Information     Clarice Nottingham, MD. Schedule an appointment as soon as possible for a visit in 1 week(s).   Specialty: Internal Medicine Contact information: [address] [place] [address] [phone]                Discharge Exam: Fredricka Weights   07/24/23 1026  Weight: 111.6 kg   General: NAD  Cardiovascular: S1, S2 present Respiratory: CTAB Abdomen: Soft, nontender, nondistended, bowel sounds present Musculoskeletal: bilateral pedal edema noted Skin: Normal Psychiatry: Normal mood   Condition at discharge: stable  The results of significant diagnostics from this hospitalization (including imaging, microbiology, ancillary and laboratory) are listed below for reference.   Imaging Studies: CT Angio Chest/Abd/Pel for Dissection W and/or Wo Contrast Result Date: 07/24/2023 CLINICAL DATA:  Aortic aneurysm. EXAM: CT ANGIOGRAPHY CHEST, ABDOMEN AND PELVIS TECHNIQUE: Non-contrast CT of the chest was initially obtained. Multidetector CT imaging through the chest, abdomen and pelvis was performed using the standard protocol during bolus administration of intravenous contrast. Multiplanar reconstructed  images and MIPs were obtained and reviewed to evaluate the vascular anatomy. RADIATION DOSE REDUCTION: This exam was performed according to the departmental dose-optimization program which includes automated exposure control, adjustment of the mA and/or kV according to patient size and/or use of iterative reconstruction technique. CONTRAST:  OMNIPAQUE  IOHEXOL  350 MG/ML SOLN COMPARISON:  CTA 11/08/2022. Older chest  CT scans. Abdomen pelvis CT 2013. FINDINGS: CTA CHEST FINDINGS Cardiovascular: No curvilinear bright density along the course of the thoracic aorta on the noncontrast dataset. The thoracic aorta has a scattered calcified atherosclerotic plaque. Diameter of the ascending aorta at the level of the right pulmonary artery measures 4.3 by 4.4 cm. The descending thoracic aorta at the same level measures 2.7 by 2.8 cm. Noncalcified plaque also seen. Diameter of the aortic root measures 3.7 cm. Diameter of the distal aortic arch approaches 3 cm. Great vessels are grossly patent with some scattered calcified plaque. There is separate origin of the left vertebral artery directly from the aortic arch proximal to the origin of the left subclavian. Heart is nonenlarged. No significant pericardial effusion. Slight prominence of the main pulmonary artery. Please correlate for any evidence of pulmonary artery hypertension. Mediastinum/Nodes: Normal caliber thoracic esophagus is slightly patulous. Small thyroid gland. No specific abnormal lymph node enlargement identified in the axillary regions, hilum or mediastinum. Lungs/Pleura: Calcified pleural plaques seen scattered throughout the left hemithorax with some volume loss and noncalcified pleural thickening. Mild calcified plaque along the right side. No significant pleural effusion. Areas of scarring and fibrotic changes along both lungs, left-greater-than-right. There is also some what may be mild rounded atelectasis along the superior segment of the left lower lobe. Appearance is similar to previous examination. No pneumothorax. No new consolidation. Stable noncalcified tiny right lower lobe lung nodule measuring 5 mm on series 8, image 128. Unchanged from September 2022 demonstrating long-term stability. No additional imaging follow-up. Musculoskeletal: Diffuse bridging syndesmophytes and osteophytes along the spine. Osteopenia. Curvature of the thoracic spine. Old left-sided  rib fractures. Degenerative changes along the right sternoclavicular joint with sclerosis. Review of the MIP images confirms the above findings. CTA ABDOMEN AND PELVIS FINDINGS VASCULAR Aorta: Severe diffuse calcified plaque without aneurysm formation or dissection. Celiac: Severe ostial stenosis best seen on sagittal images. Slight poststenotic dilatation. SMA: Moderate calcified plaque at the origin with moderate to severe stenosis of the origin. Replaced hepatic artery from the SMA. Renals: 2 left single right renal arteries. Moderate calcified plaque. IMA: Grossly patent Inflow: Scattered calcified plaque along the iliac vessels diffusely. Mild areas of stenosis along the iliac vessels. There is more moderate stenosis related to calcified plaque along the right common femoral artery focally. Please correlate with symptoms. Veins: No obvious venous abnormality within the limitations of this arterial phase study. Review of the MIP images confirms the above findings. NON-VASCULAR Hepatobiliary: With the limits of the early phase of the arterial bolus, grossly the liver is preserved. Gallbladder is present. Pancreas: Unremarkable. No pancreatic ductal dilatation or surrounding inflammatory changes. Spleen: Normal in size without focal abnormality. Adrenals/Urinary Tract: Right adrenal gland is preserved. Left adrenal gland is diffusely thickened and nodular. Unchanged from previous examinations. No enhancing renal mass. The ureters have normal course and caliber extending down to the urinary bladder. Preserved contours of the urinary bladder. Stomach/Bowel: No oral contrast. Large bowel has a redundant course. Significant colonic stool diffusely. There is ectasia of the cecum measuring up to 10 cm. Luminal air and fluid along the colon particularly the transverse colon. Small  bowel is nondilated. Lymphatic: No specific abnormal lymph node enlargement in the abdomen pelvis. Reproductive: Surgical changes along the  prostate bed. Other: No free air or free fluid. Musculoskeletal: Degenerative changes of the spine and pelvis. Review of the MIP images confirms the above findings. IMPRESSION: Extensive vascular calcifications. No dissection identified. Mild ectasia once again of the ascending aorta up to 4.4 cm. Recommend annual imaging followup by CTA or MRA. This recommendation follows 2010 ACCF/AHA/AATS/ACR/ASA/SCA/SCAI/SIR/STS/SVM Guidelines for the Diagnosis and Management of Patients with Thoracic Aortic Disease. Circulation. 2010; 121: Z733-z630. Aortic aneurysm NOS (ICD10-I71.9) Significant plaque along the mesenteric vessels with significant stenosis of the celiac and SMA. Please correlate with any symptoms. Moderate stenosis of the right common femoral artery related to calcified plaque. Diffuse colonic stool with dilatation of the right side of the colon and air-fluid level in the ascending and transverse colon. Please correlate with symptoms. No free air or inflammatory changes. Calcified pleural plaquing.  Areas of scarring and fibrotic changes. Electronically Signed   By: Ranell Bring M.D.   On: 07/24/2023 12:41   VAS US  CAROTID Result Date: 07/06/2023 Carotid Arterial Duplex Study Patient Name:  Robert Hensley  Date of Exam:   07/05/2023 Medical Rec #: [id number]      Accession #:    [id number] Date of Birth: 1941/02/24       Patient Gender: M Patient Age:   49 years Exam Location:  [facility] Procedure:      VAS US  CAROTID Referring Phys: GORDY BERGAMO --------------------------------------------------------------------------------  Indications:       Carotid artery disease and Asymptomatic bilateral carotid                    artery stenosis [I65.23 (ICD-10-CM)]. Risk Factors:      Hypertension, hyperlipidemia, past history of smoking. Other Factors:     Pre-diabetes. Comparison Study:  Carotid artery duplex 12/27/2022 (outside facility):                    Duplex suggests stenosis in the right internal carotid artery                     (minimal).                    <50% stenosis in the right external carotid artery.                    Duplex suggests stenosis in the left internal carotid artery                    (50-69%).                    <50% stenosis in the left external carotid artery.                    Antegrade right vertebral artery flow. Antegrade left                    vertebral artery                    flow. Performing Technologist: Rosaline Fujisawa MHA, RDMS, RVT, RDCS  Examination Guidelines: A complete evaluation includes B-mode imaging, spectral Doppler, color Doppler, and power Doppler as needed of all accessible portions of each vessel. Bilateral testing is considered an integral part of a complete examination. Limited examinations for reoccurring indications may be performed as noted.  Right Carotid Findings: +---------+--------+-------+--------+------------------------+-----------------+          PSV cm/sEDV    StenosisPlaque Description      Comments                           cm/s                                                     +---------+--------+-------+--------+------------------------+-----------------+ CCA Prox 61      9                                      intimal                                                                   thickening        +---------+--------+-------+--------+------------------------+-----------------+ CCA      55      9                                      Shadowing         Distal                                                                    +---------+--------+-------+--------+------------------------+-----------------+ ICA Prox 59      9      1-39%   homogeneous and                                                           irregular                                 +---------+--------+-------+--------+------------------------+-----------------+ ICA Mid  99      16             heterogenous and                                                           irregular                                 +---------+--------+-------+--------+------------------------+-----------------+ ICA      74      10  Distal                                                                    +---------+--------+-------+--------+------------------------+-----------------+ ECA      177                    heterogenous and                                                          irregular                                 +---------+--------+-------+--------+------------------------+-----------------+ +----------+--------+-------+---------+-------------------+           PSV cm/sEDV cmsDescribe Arm Pressure (mmHG) +----------+--------+-------+---------+-------------------+ Subclavian100            Turbulent123                 +----------+--------+-------+---------+-------------------+ +---------+--------+--+--------+-+-----------------------------------+ VertebralPSV cm/s13EDV cm/s3Systolic deceleration and Antegrade +---------+--------+--+--------+-+-----------------------------------+  Left Carotid Findings: +----------+--------+--------+--------+------------------------------+---------+           PSV cm/sEDV cm/sStenosisPlaque Description            Comments  +----------+--------+--------+--------+------------------------------+---------+ CCA Prox  61      12              smooth and heterogenous                 +----------+--------+--------+--------+------------------------------+---------+ CCA Distal60      9                                             Shadowing +----------+--------+--------+--------+------------------------------+---------+ ICA Prox  57      10      1-39%   heterogenous, irregular and                                               calcific                                 +----------+--------+--------+--------+------------------------------+---------+ ICA Mid   121     22      1-39%   calcific                                +----------+--------+--------+--------+------------------------------+---------+ ICA Distal112     22                                                      +----------+--------+--------+--------+------------------------------+---------+ ECA       188     9  heterogenous and calcific               +----------+--------+--------+--------+------------------------------+---------+ +----------+--------+--------+---------+-------------------+           PSV cm/sEDV cm/sDescribe Arm Pressure (mmHG) +----------+--------+--------+---------+-------------------+ [id number]              Turbulent153                 +----------+--------+--------+---------+-------------------+ +---------+--------+--+--------+-+---------+ VertebralPSV cm/s38EDV cm/s5Antegrade +---------+--------+--+--------+-+---------+   Summary: Right Carotid: Velocities in the right ICA are consistent with a 1-39% stenosis. Left Carotid: Velocities in the left ICA are consistent with a 1-39% stenosis. Vertebrals:  Left vertebral artery demonstrates antegrade flow. Right vertebral              artery exhibits systolic deceleration. There is a 30 mmHg              difference between brachial pressures, suggestive of possible left              proximal stenosis. Subclavians: Right subclavian artery flow was disturbed. *See table(s) above for measurements and observations.  Electronically signed by Erick Bergamo on 07/06/2023 at 8:41:11 PM.    Final     Microbiology: Results for orders placed or performed during the hospital encounter of 07/24/23  Blood Culture (routine x 2)     Status: None (Preliminary result)   Collection Time: 07/24/23  9:49 AM   Specimen: BLOOD LEFT WRIST  Result Value Ref Range Status   Specimen Description   Final    BLOOD  LEFT WRIST Performed at [facility], [address] [place], [place] [address]    Special Requests   Final    BOTTLES DRAWN AEROBIC AND ANAEROBIC Blood Culture adequate volume Performed at [facility], [address] [place], [place] [address]    Culture   Final    NO GROWTH 2 DAYS Performed at [facility], [address] [place], [place] [address]    Report Status PENDING  Incomplete  Blood Culture (routine x 2)     Status: None (Preliminary result)   Collection Time: 07/24/23  9:54 AM   Specimen: BLOOD  Result Value Ref Range Status   Specimen Description   Final    BLOOD LEFT ANTECUBITAL Performed at [facility], [address] [place], [place] [address]    Special Requests   Final    BOTTLES DRAWN AEROBIC AND ANAEROBIC Blood Culture adequate volume Performed at [facility], [address] [place], [place] [address]    Culture   Final    NO GROWTH 2 DAYS Performed at [facility], [address], [place], [place] [address]    Report Status PENDING  Incomplete  Resp panel by RT-PCR (RSV, Flu A&B, Covid) Anterior Nasal Swab     Status: Abnormal   Collection Time: 07/24/23 10:23 AM   Specimen: Anterior Nasal Swab  Result Value Ref Range Status   SARS Coronavirus 2 by RT PCR POSITIVE (A) NEGATIVE Final    Comment: (NOTE) SARS-CoV-2 target nucleic acids are DETECTED.  The SARS-CoV-2 RNA is generally detectable in upper respiratory specimens during the acute phase of infection. Positive results are indicative of the presence of the identified virus, but do not rule out bacterial infection or co-infection with other pathogens not detected by the test. Clinical correlation with patient history and other diagnostic information is necessary to determine patient infection status. The expected result is Negative.  Fact Sheet for Patients: bloggercourse.com  Fact Sheet  for Healthcare Providers: seriousbroker.it  This test is not yet approved or cleared by the [place]  FDA and  has been authorized for detection and/or diagnosis of SARS-CoV-2 by FDA under an Emergency Use Authorization (EUA).  This EUA will remain in effect (meaning this test can be used) for the duration of  the COVID-19 declaration under Section 564(b)(1) of the A ct, 21 U.S.C. section 360bbb-3(b)(1), unless the authorization is terminated or revoked sooner.     Influenza A by PCR NEGATIVE NEGATIVE Final   Influenza B by PCR NEGATIVE NEGATIVE Final    Comment: (NOTE) The Xpert Xpress SARS-CoV-2/FLU/RSV plus assay is intended as an aid in the diagnosis of influenza from Nasopharyngeal swab specimens and should not be used as a sole basis for treatment. Nasal washings and aspirates are unacceptable for Xpert Xpress SARS-CoV-2/FLU/RSV testing.  Fact Sheet for Patients: bloggercourse.com  Fact Sheet for Healthcare Providers: seriousbroker.it  This test is not yet approved or cleared by the [place]  FDA and has been authorized for detection and/or diagnosis of SARS-CoV-2 by FDA under an Emergency Use Authorization (EUA). This EUA will remain in effect (meaning this test can be used) for the duration of the COVID-19 declaration under Section 564(b)(1) of the Act, 21 U.S.C. section 360bbb-3(b)(1), unless the authorization is terminated or revoked.     Resp Syncytial Virus by PCR NEGATIVE NEGATIVE Final    Comment: (NOTE) Fact Sheet for Patients: bloggercourse.com  Fact Sheet for Healthcare Providers: seriousbroker.it  This test is not yet approved or cleared by the [place]  FDA and has been authorized for detection and/or diagnosis of SARS-CoV-2 by FDA under an Emergency Use Authorization (EUA). This EUA will remain in effect (meaning this  test can be used) for the duration of the COVID-19 declaration under Section 564(b)(1) of the Act, 21 U.S.C. section 360bbb-3(b)(1), unless the authorization is terminated or revoked.  Performed at [facility], [address] [place], [place] [address]     Labs: CBC: Recent Labs  Lab 07/24/23 0930 07/25/23 0954 07/26/23 0349  WBC 13.0* 12.4* 11.6*  NEUTROABS  --  9.7* 8.5*  HGB 13.6 13.4 12.7*  HCT 39.3 39.2 37.9*  MCV 88.3 90.5 92.2  PLT 321 315 267   Basic Metabolic Panel: Recent Labs  Lab 07/24/23 0930 07/25/23 0954 07/26/23 0349  NA 132* 128* 131*  K 3.9 4.0 4.0  CL 94* 93* 97*  CO2 29 28 26   GLUCOSE 118* 100* 102*  BUN 23 19 16   CREATININE 0.74 0.56* 0.54*  CALCIUM  9.3 8.6* 8.7*   Liver Function Tests: Recent Labs  Lab 07/24/23 0930  AST 18  ALT 25  ALKPHOS 101  BILITOT 0.8  PROT 6.7  ALBUMIN 4.0   CBG: No results for input(s): GLUCAP in the last 168 hours.  Discharge time spent: less than 30 minutes.  Signed: Lebron JINNY Cage, MD [facility] 07/26/2023

## 2023-07-29 LAB — CULTURE, BLOOD (ROUTINE X 2)
Culture: NO GROWTH
Culture: NO GROWTH
Special Requests: ADEQUATE
Special Requests: ADEQUATE

## 2023-07-31 DIAGNOSIS — K5900 Constipation, unspecified: Secondary | ICD-10-CM | POA: Diagnosis not present

## 2023-07-31 DIAGNOSIS — U071 COVID-19: Secondary | ICD-10-CM | POA: Diagnosis not present

## 2023-07-31 DIAGNOSIS — Z09 Encounter for follow-up examination after completed treatment for conditions other than malignant neoplasm: Secondary | ICD-10-CM | POA: Diagnosis not present

## 2023-08-02 ENCOUNTER — Ambulatory Visit (HOSPITAL_COMMUNITY): Payer: Medicare Other | Attending: Cardiology

## 2023-08-02 DIAGNOSIS — I7121 Aneurysm of the ascending aorta, without rupture: Secondary | ICD-10-CM | POA: Insufficient documentation

## 2023-08-02 DIAGNOSIS — I480 Paroxysmal atrial fibrillation: Secondary | ICD-10-CM | POA: Diagnosis not present

## 2023-08-02 LAB — ECHOCARDIOGRAM COMPLETE
Area-P 1/2: 3.4 cm2
P 1/2 time: 1454 ms
S' Lateral: 4.2 cm

## 2023-08-03 NOTE — Progress Notes (Signed)
 Ao Root diam: 4.00 cm Ao Asc diam:  3.90 cm Mild aortic root dilatation at most, CT scan reveals ascending aorta to be 4.3 cm, overall stable, will continue observation for now.

## 2023-08-09 NOTE — Progress Notes (Signed)
Chief Complaint: Constipation Primary GI MD: Dr. Rhea Belton  HPI: 83 year old male history of paroxysmal A-fib (on Eliquis) s/p DCCV in 2019, asymptomatic bilaterally carotid artery atherosclerosis, hypertension, hyperlipidemia, COPD, AAA measuring 4.6 cm, PAD, chronic PVCs, prostate cancer s/p radical prostatectomy 1993, presents for evaluation of constipation.  Recently admitted 12/31-1/2 with complaints of abdominal pain and distention associated with nausea and poor intake as well as irritation despite taking stool softeners.  CTA chest abdomen pelvis showed diffuse colonic stool with dilation of the right side of the colon and air-fluid level in the ascending and transverse colon. Plaque in mesenteric vessels and stenosis of celiac and SMA.  Also tested positive for COVID-19 with no respiratory symptoms.  Outpatient ECHO 08/02/23 showed mild aortic root dilation, ascending aorta stable. Normal heart function with EF 60-65%  Patient was given bowel regimen and had multiple bowel movements with improvement and was discharged.   The patient presents with chronic constipation that has worsened since starting amiodarone. He reports a history of cardioversion approximately 4-5 years ago, after which the amiodarone dosage was gradually reduced. Currently, he is taking a half tablet every other day.  The patient has been managing his constipation with daily stool softeners and Senokot as needed. He reports having bowel movements daily that are soft and formed without straining.  He recalls an episode about 4-5 weeks ago where he noticed streaks of red blood on the stool, which he attributes to the straining.  He denies any abdominal pain, nausea, vomiting, or black stools. Denies weight loss. Feels like he is doing much better.  Patient is hesitant towards colonoscopy and would prefer to avoid endoscopic procedures     PREVIOUS GI WORKUP   Colonoscopy 2013 with Dr. Rhea Belton 3 mm sessile polyp was  found in ascending colon (tubular adenoma) Mild diverticulosis in left colon Internal hemorrhoids   Past Medical History:  Diagnosis Date   A-fib (HCC)    Blood transfusion    Complication of anesthesia    Diverticulitis    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HCC)    GI bleed    Hyperlipidemia    Hypertension    Pneumonia    Prostate cancer [facility])     Past Surgical History:  Procedure Laterality Date   ABDOMINAL AORTOGRAM W/LOWER EXTREMITY Bilateral 11/22/2021   Procedure: ABDOMINAL AORTOGRAM W/LOWER EXTREMITY;  Surgeon: Yates Decamp, MD;  Location: [facility];  Service: Cardiovascular;  Laterality: Bilateral;   CARDIOVERSION N/A 09/11/2017   Procedure: CARDIOVERSION;  Surgeon: Elder Negus, MD;  Location: [facility] ENDOSCOPY;  Service: Cardiovascular;  Laterality: N/A;   CATARACT EXTRACTION     cyst removed from back     cyst removed from right knee     LEFT HEART CATH AND CORONARY ANGIOGRAPHY N/A 08/28/2017   Procedure: LEFT HEART CATH AND CORONARY ANGIOGRAPHY;  Surgeon: Elder Negus, MD;  Location: [facility] CV [facility];  Service: Cardiovascular;  Laterality: N/A;   PROSTATECTOMY     TESTICLE REMOVAL     Right   TUMOR REMOVAL  07/24/1974   Between lung and chest wall    Current Outpatient Medications  Medication Sig Dispense Refill   amiodarone (PACERONE) 200 MG tablet TAKE ONE-HALF TABLET BY MOUTH  DAILY (Patient taking differently: Take 100 mg by mouth every other day.) 40 tablet 2   amLODipine (NORVASC) 10 MG tablet Take 10 mg by mouth daily.     atorvastatin (LIPITOR) 40 MG tablet Take 40 mg by mouth daily.  B Complex Vitamins (B COMPLEX-B12 PO) Take 1 tablet by mouth daily.     cyclobenzaprine (FLEXERIL) 5 MG tablet Take 5-10 mg by mouth at bedtime as needed.     diphenhydramine-acetaminophen (TYLENOL PM) 25-500 MG TABS tablet Take 1 tablet by mouth at bedtime.     ELIQUIS 5 MG TABS tablet TAKE 1 TABLET BY MOUTH TWICE  DAILY 200 tablet 2   melatonin 5 MG TABS Take  5 mg by mouth at bedtime. Take on extra if needed     metoprolol tartrate (LOPRESSOR) 25 MG tablet Take 12.5 mg by mouth 2 (two) times daily.      Multiple Vitamin (MULTIVITAMIN) tablet Take 1 tablet by mouth daily.     Multiple Vitamins-Minerals (EQ VISION FORMULA 50+ PO) Take 1 capsule by mouth daily.     olmesartan-hydrochlorothiazide (BENICAR HCT) 40-25 MG tablet TAKE 1 TABLET BY MOUTH IN THE  MORNING 100 tablet 2   senna-docusate (SENOKOT-S) 8.6-50 MG tablet Take 1 tablet by mouth at bedtime.     traMADol (ULTRAM) 50 MG tablet Take 50-100 mg by mouth 3 (three) times daily. *may use with tylenol maximum of 3000 mg per day*     vitamin C (ASCORBIC ACID) 500 MG tablet Take 500 mg by mouth daily.      Wheat Dextrin (EQL FIBER SUPPLEMENT PO) Take by mouth daily.     polyethylene glycol (MIRALAX / GLYCOLAX) 17 g packet Take 17 g by mouth daily. (Patient not taking: Reported on 08/10/2023)     No current facility-administered medications for this visit.    Allergies as of 08/10/2023 - Review Complete 08/10/2023  Allergen Reaction Noted   Penicillins Anaphylaxis and Other (See Comments) 11/01/2011   Benzocaine Other (See Comments) 04/26/2021   Other Other (See Comments) 08/20/2017    Family History  Problem Relation Age of Onset   Liver cancer Sister    Cancer Brother    Prostate cancer Brother    Heart failure Maternal Aunt    Hypertension Maternal Aunt     Social History   Socioeconomic History   Marital status: Legally Separated    Spouse name: Not on file   Number of children: 1   Years of education: Not on file   Highest education level: Not on file  Occupational History   Occupation: Retired    Associate Professor: RETIRED  Tobacco Use   Smoking status: Former    Current packs/day: 0.00    Average packs/day: 1 pack/day for 25.0 years (25.0 ttl pk-yrs)    Types: Cigarettes    Start date: 09/21/1949    Quit date: 09/22/1974    Years since quitting: 48.9   Smokeless tobacco: Former     Types: Chew    Quit date: 07/24/1994  Vaping Use   Vaping status: Never Used  Substance and Sexual Activity   Alcohol use: No   Drug use: No   Sexual activity: Not on file  Other Topics Concern   Not on file  Social History Narrative   Not on file   Social Drivers of Health   Financial Resource Strain: Not on file  Food Insecurity: No Food Insecurity (07/25/2023)   Hunger Vital Sign    Worried About Running Out of Food in the Last Year: Never true    Ran Out of Food in the Last Year: Never true  Transportation Needs: No Transportation Needs (07/25/2023)   PRAPARE - Transportation    Lack of Transportation (Medical): No    Lack  of Transportation (Non-Medical): No  Physical Activity: Not on file  Stress: Not on file  Social Connections: Moderately Isolated (07/25/2023)   Social Connection and Isolation Panel [NHANES]    Frequency of Communication with Friends and Family: More than three times a week    Frequency of Social Gatherings with Friends and Family: More than three times a week    Attends Religious Services: More than 4 times per year    Active Member of [employer] or Organizations: No    Attends Banker Meetings: Never    Marital Status: Widowed  Intimate Partner Violence: Not At Risk (07/25/2023)   Humiliation, Afraid, Rape, and Kick questionnaire    Fear of Current or Ex-Partner: No    Emotionally Abused: No    Physically Abused: No    Sexually Abused: No    Review of Systems:    Constitutional: No weight loss, fever, chills, weakness or fatigue HEENT: Eyes: No change in vision               Ears, Nose, Throat:  No change in hearing or congestion Skin: No rash or itching Cardiovascular: No chest pain, chest pressure or palpitations   Respiratory: No SOB or cough Gastrointestinal: See HPI and otherwise negative Genitourinary: No dysuria or change in urinary frequency Neurological: No headache, dizziness or syncope Musculoskeletal: No new muscle or joint  pain Hematologic: No bleeding or bruising Psychiatric: No history of depression or anxiety    Physical Exam:  Vital signs: BP (!) 164/62   Pulse 60   Ht 6\' 1"  (1.854 m)   Wt 247 lb (112 kg)   BMI 32.59 kg/m   Constitutional: NAD, Well developed, Well nourished, alert and cooperative. Appears younger than stated age. Needed assistance to get on exam table. Head:  Normocephalic and atraumatic. Eyes:   PEERL, EOMI. No icterus. Conjunctiva pink. Respiratory: Respirations even and unlabored. Lungs clear to auscultation bilaterally.   No wheezes, crackles, or rhonchi.  Cardiovascular:  Regular rate and rhythm. No peripheral edema, cyanosis or pallor.  Gastrointestinal:  Soft, nondistended, nontender. No rebound or guarding. Normal bowel sounds. No appreciable masses or hepatomegaly. Rectal:  Not performed.  Msk:  Symmetrical without gross deformities. Without edema, no deformity or joint abnormality.  Neurologic:  Alert and  oriented x4;  grossly normal neurologically.  Skin:   Dry and intact without significant lesions or rashes. Psychiatric: Oriented to person, place and time. Demonstrates good judgement and reason without abnormal affect or behaviors.   RELEVANT LABS AND IMAGING: CBC    Component Value Date/Time   WBC 11.6 (H) 07/26/2023 0349   RBC 4.11 (L) 07/26/2023 0349   HGB 12.7 (L) 07/26/2023 0349   HGB 13.1 11/11/2021 0939   HCT 37.9 (L) 07/26/2023 0349   HCT 37.9 11/11/2021 0939   PLT 267 07/26/2023 0349   PLT 241 11/11/2021 0939   MCV 92.2 07/26/2023 0349   MCV 89 11/11/2021 0939   MCH 30.9 07/26/2023 0349   MCHC 33.5 07/26/2023 0349   RDW 13.9 07/26/2023 0349   RDW 13.1 11/11/2021 0939   LYMPHSABS 2.2 07/26/2023 0349   MONOABS 0.7 07/26/2023 0349   EOSABS 0.1 07/26/2023 0349   BASOSABS 0.0 07/26/2023 0349    CMP     Component Value Date/Time   NA 131 (L) 07/26/2023 0349   NA 135 11/11/2021 0939   K 4.0 07/26/2023 0349   CL 97 (L) 07/26/2023 0349   CO2  26 07/26/2023 0349   GLUCOSE  102 (H) 07/26/2023 0349   BUN 16 07/26/2023 0349   BUN 11 11/11/2021 0939   CREATININE 0.54 (L) 07/26/2023 0349   CALCIUM 8.7 (L) 07/26/2023 0349   PROT 6.7 07/24/2023 0930   ALBUMIN 4.0 07/24/2023 0930   AST 18 07/24/2023 0930   ALT 25 07/24/2023 0930   ALKPHOS 101 07/24/2023 0930   BILITOT 0.8 07/24/2023 0930   GFRNONAA >60 07/26/2023 0349   GFRAA >60 04/19/2011 1902     Assessment/Plan:   83 year old male history of paroxysmal A-fib (on Eliquis) s/p DCCV in 2019, asymptomatic bilaterally carotid artery atherosclerosis, hypertension, hyperlipidemia, COPD, AAA measuring 4.6 cm, PAD, chronic PVCs, prostate cancer s/p radical prostatectomy 1993, presents for acute on chronic constipation worsened after amiodarone with CT showing large stool throughout colon and stenosis of SMA and atherosclerosis of mesenteric vessels.   Acute on Chronic Constipation Worsened with initiation of Amiodarone for atrial fibrillation. Currently managed with stool softeners and Senokot as needed with adequate control, though he preferred miralax. No abdominal pain, nausea, vomiting, or significant rectal bleeding. Hesitant towards repeat colonoscopy. Last colonoscopy in 2013 with 3mm tubular adenoma. --Start Miralax one capful daily with adequate hydration (64 ounces of water daily). -- discussed possibility of getting colonoscopy.  Patient would be higher risk with his significant comorbidities and age.  He is also hesitant towards procedure.  Patient would prefer conservative management. --Return for follow-up in 8-12 weeks to assess response to new regimen.  Mesenteric Vascular Disease Noted on recent CT scan. --Consider referral to vascular surgery for further evaluation.  Patient is hesitant about this and would like to discuss further at follow-up  Colonoscopy Discussed but deferred due to patient's age, comorbidities, and cost considerations. --Continue conservative  management of constipation. Reconsider colonoscopy if symptoms worsen or do not improve with new regimen.      Lara Mulch [facility] 08/10/2023, 9:05 AM  Cc: Merri Brunette, MD

## 2023-08-10 ENCOUNTER — Encounter: Payer: Self-pay | Admitting: Gastroenterology

## 2023-08-10 ENCOUNTER — Ambulatory Visit: Payer: Medicare Other | Admitting: Gastroenterology

## 2023-08-10 VITALS — BP 164/62 | HR 60 | Ht 73.0 in | Wt 247.0 lb

## 2023-08-10 DIAGNOSIS — K5909 Other constipation: Secondary | ICD-10-CM | POA: Diagnosis not present

## 2023-08-10 DIAGNOSIS — K559 Vascular disorder of intestine, unspecified: Secondary | ICD-10-CM | POA: Diagnosis not present

## 2023-08-10 DIAGNOSIS — K551 Chronic vascular disorders of intestine: Secondary | ICD-10-CM

## 2023-08-10 DIAGNOSIS — K5900 Constipation, unspecified: Secondary | ICD-10-CM

## 2023-08-10 NOTE — Patient Instructions (Addendum)
Start taking Miralax 1 capful (17 grams) 1x / day for 1 week.   If this is not effective, increase to 1 dose 2x / day for 1 week.   If this is still not effective, increase to two capfuls (34 grams) 2x / day.   Can adjust dose as needed based on response. Can take 1/2 cap daily, skip days, or increase per day.    You have been scheduled for an appointment with Robert Hensley on 09-24-23 at 9 . Please arrive 10 minutes early for your appointment.  It was a pleasure to see you today!  Thank you for trusting me with your gastrointestinal care!

## 2023-08-11 NOTE — Progress Notes (Signed)
Addendum: Reviewed and agree with assessment and management plan. Kadijah Shamoon M, MD  

## 2023-08-12 NOTE — Progress Notes (Signed)
Normal LV function. Stable and minimally dilated aorta, nothing to be worried.

## 2023-09-24 ENCOUNTER — Ambulatory Visit: Payer: Medicare Other | Admitting: Gastroenterology

## 2023-09-24 ENCOUNTER — Encounter: Payer: Self-pay | Admitting: Gastroenterology

## 2023-09-24 VITALS — BP 126/70 | HR 69 | Wt 245.0 lb

## 2023-09-24 DIAGNOSIS — K551 Chronic vascular disorders of intestine: Secondary | ICD-10-CM | POA: Diagnosis not present

## 2023-09-24 DIAGNOSIS — K5900 Constipation, unspecified: Secondary | ICD-10-CM

## 2023-09-24 DIAGNOSIS — K5909 Other constipation: Secondary | ICD-10-CM | POA: Diagnosis not present

## 2023-09-24 DIAGNOSIS — Z860100 Personal history of colon polyps, unspecified: Secondary | ICD-10-CM

## 2023-09-24 NOTE — Patient Instructions (Signed)
 Follow up in 6 months.  Thank you for trusting me with your gastrointestinal care!   Boone Master, PA   ______________________________________________________  If your blood pressure at your visit was 140/90 or greater, please contact your primary care physician to follow up on this.  _______________________________________________________  If you are age 85 or older, your body mass index should be between 23-30. Your Body mass index is 32.32 kg/m. If this is out of the aforementioned range listed, please consider follow up with your Primary Care Provider.  If you are age 76 or younger, your body mass index should be between 19-25. Your Body mass index is 32.32 kg/m. If this is out of the aformentioned range listed, please consider follow up with your Primary Care Provider.   ________________________________________________________  The [facility] providers would like to encourage you to use [facility] to communicate with providers for non-urgent requests or questions.  Due to long hold times on the telephone, sending your provider a message by [facility] may be a faster and more efficient way to get a response.  Please allow 48 business hours for a response.  Please remember that this is for non-urgent requests.  _______________________________________________________

## 2023-09-24 NOTE — Progress Notes (Signed)
 Addendum: Reviewed and agree with assessment and management plan. Asha Grumbine, Carie Caddy, MD

## 2023-09-24 NOTE — Progress Notes (Signed)
 Chief Complaint: Follow-up of constipation Primary GI MD: Dr. Rhea Hensley  HPI: 83 year old male history of paroxysmal A-fib (on Eliquis) s/p DCCV in 2019, asymptomatic bilaterally carotid artery atherosclerosis, hypertension, hyperlipidemia, COPD, AAA measuring 4.6 cm, PAD, chronic PVCs, prostate cancer s/p radical prostatectomy 1993, presents for follow-up of constipation  Last seen 08/10/2023, see last note for details. Patient was experiencing acute on chronic constipation worsened with initiation of amiodarone for A-fib.  Recommended MiraLAX 1 capful daily.  Colonoscopy was deferred due to age.  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  He takes 2 capfuls of MiraLAX per day and 1 "red pill" which is a stool softener at bedtime.  He states by doing this he has had adequate bowel movements and is feeling much better.  He states sometimes if he does not eat his diet high in fiber he may become more constipated and when he has periods of this he will take 2 stool softeners at bedtime.  No complaints today and doing well overall.    PREVIOUS GI WORKUP   Colonoscopy 2013 with Dr. Rhea Hensley 3 mm sessile polyp was found in ascending colon (tubular adenoma) Mild diverticulosis in left colon Internal hemorrhoids  Past Medical History:  Diagnosis Date   A-fib (HCC)    Blood transfusion    Complication of anesthesia    Diverticulitis    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HCC)    GI bleed    Hyperlipidemia    Hypertension    Pneumonia    Prostate cancer [facility])     Past Surgical History:  Procedure Laterality Date   ABDOMINAL AORTOGRAM W/LOWER EXTREMITY Bilateral 11/22/2021   Procedure: ABDOMINAL AORTOGRAM W/LOWER EXTREMITY;  Surgeon: Robert Decamp, MD;  Location: [facility] INVASIVE CV LAB;  Service:  Cardiovascular;  Laterality: Bilateral;   CARDIOVERSION N/A 09/11/2017   Procedure: CARDIOVERSION;  Surgeon: Robert Negus, MD;  Location: [facility] ENDOSCOPY;  Service: Cardiovascular;  Laterality: N/A;   CATARACT EXTRACTION     cyst removed from back     cyst removed from right knee     LEFT HEART CATH AND CORONARY ANGIOGRAPHY N/A 08/28/2017   Procedure: LEFT HEART CATH AND CORONARY ANGIOGRAPHY;  Surgeon: Robert Negus, MD;  Location: [facility] INVASIVE CV LAB;  Service: Cardiovascular;  Laterality: N/A;   PROSTATECTOMY     TESTICLE REMOVAL     Right   TUMOR REMOVAL  07/24/1974   Between lung and chest wall    Current Outpatient Medications  Medication Sig Dispense Refill   amiodarone (PACERONE) 200 MG tablet TAKE ONE-HALF TABLET BY MOUTH  DAILY (Patient taking differently: Take 100 mg by mouth every other day. Half a tablet every other day) 40 tablet 2   amLODipine (NORVASC) 10 MG tablet Take 10 mg by mouth daily.     atorvastatin (LIPITOR) 40 MG tablet Take 40 mg by mouth daily.     B Complex Vitamins (B COMPLEX-B12 PO) Take 1 tablet by mouth daily.     diphenhydramine-acetaminophen (TYLENOL PM) 25-500 MG TABS tablet Take 1 tablet by mouth at bedtime.     ELIQUIS 5 MG TABS tablet TAKE 1 TABLET BY MOUTH TWICE  DAILY 200 tablet 2   melatonin 5 MG TABS Take 5 mg by mouth at bedtime. Take on extra if needed     metoprolol tartrate (LOPRESSOR) 25 MG tablet  Take 12.5 mg by mouth 2 (two) times daily.      Multiple Vitamin (MULTIVITAMIN) tablet Take 1 tablet by mouth daily.     Multiple Vitamins-Minerals (EQ VISION FORMULA 50+ PO) Take 1 capsule by mouth daily.     olmesartan-hydrochlorothiazide (BENICAR HCT) 40-25 MG tablet TAKE 1 TABLET BY MOUTH IN THE  MORNING 100 tablet 2   polyethylene glycol (MIRALAX / GLYCOLAX) 17 g packet Take 17 g by mouth daily.     senna-docusate (SENOKOT-S) 8.6-50 MG tablet Take 1 tablet by mouth at bedtime.     vitamin C (ASCORBIC ACID) 500 MG tablet Take 500  mg by mouth daily.      cyclobenzaprine (FLEXERIL) 5 MG tablet Take 5-10 mg by mouth at bedtime as needed. (Patient not taking: Reported on 09/24/2023)     traMADol (ULTRAM) 50 MG tablet Take 50-100 mg by mouth 3 (three) times daily. *may use with tylenol maximum of 3000 mg per day* (Patient not taking: Reported on 09/24/2023)     Wheat Dextrin (EQL FIBER SUPPLEMENT PO) Take by mouth daily. (Patient not taking: Reported on 09/24/2023)     No current facility-administered medications for this visit.    Allergies as of 09/24/2023 - Review Complete 09/24/2023  Allergen Reaction Noted   Penicillins Anaphylaxis and Other (See Comments) 11/01/2011   Benzocaine Other (See Comments) 04/26/2021   Other Other (See Comments) 08/20/2017    Family History  Problem Relation Age of Onset   Liver cancer Sister    Cancer Brother    Prostate cancer Brother    Heart failure Maternal Aunt    Hypertension Maternal Aunt     Social History   Socioeconomic History   Marital status: Legally Separated    Spouse name: Not on file   Number of children: 1   Years of education: Not on file   Highest education level: Not on file  Occupational History   Occupation: Retired    Associate Professor: RETIRED  Tobacco Use   Smoking status: Former    Current packs/day: 0.00    Average packs/day: 1 pack/day for 25.0 years (25.0 ttl pk-yrs)    Types: Cigarettes    Start date: 09/21/1949    Quit date: 09/22/1974    Years since quitting: 49.0   Smokeless tobacco: Former    Types: Chew    Quit date: 07/24/1994  Vaping Use   Vaping status: Never Used  Substance and Sexual Activity   Alcohol use: No   Drug use: No   Sexual activity: Not on file  Other Topics Concern   Not on file  Social History Narrative   Not on file   Social Drivers of Health   Financial Resource Strain: Not on file  Food Insecurity: No Food Insecurity (07/25/2023)   Hunger Vital Sign    Worried About Running Out of Food in the Last Year: Never true     Ran Out of Food in the Last Year: Never true  Transportation Needs: No Transportation Needs (07/25/2023)   PRAPARE - Administrator, Civil Service (Medical): No    Lack of Transportation (Non-Medical): No  Physical Activity: Not on file  Stress: Not on file  Social Connections: Moderately Isolated (07/25/2023)   Social Connection and Isolation Panel [NHANES]    Frequency of Communication with Friends and Family: More than three times a week    Frequency of Social Gatherings with Friends and Family: More than three times a week    Attends  Religious Services: More than 4 times per year    Active Member of Clubs or Organizations: No    Attends Banker Meetings: Never    Marital Status: Widowed  Intimate Partner Violence: Not At Risk (07/25/2023)   Humiliation, Afraid, Rape, and Kick questionnaire    Fear of Current or Ex-Partner: No    Emotionally Abused: No    Physically Abused: No    Sexually Abused: No    Review of Systems:    Constitutional: No weight loss, fever, chills, weakness or fatigue HEENT: Eyes: No change in vision               Ears, Nose, Throat:  No change in hearing or congestion Skin: No rash or itching Cardiovascular: No chest pain, chest pressure or palpitations   Respiratory: No SOB or cough Gastrointestinal: See HPI and otherwise negative Genitourinary: No dysuria or change in urinary frequency Neurological: No headache, dizziness or syncope Musculoskeletal: No new muscle or joint pain Hematologic: No bleeding or bruising Psychiatric: No history of depression or anxiety    Physical Exam:  Vital signs: BP 126/70   Pulse 69   Wt 245 lb (111.1 kg)   BMI 32.32 kg/m   Constitutional: NAD, Well developed, Well nourished, alert and cooperative Head:  Normocephalic and atraumatic. Eyes:   PEERL, EOMI. No icterus. Conjunctiva pink. Respiratory: Respirations even and unlabored. Lungs clear to auscultation bilaterally.   No wheezes, crackles,  or rhonchi.  Cardiovascular:  Regular rate and rhythm. No peripheral edema, cyanosis or pallor.  Gastrointestinal:  Soft, nondistended, nontender. No rebound or guarding. Normal bowel sounds. No appreciable masses or hepatomegaly. Rectal:  Not performed.  Msk:  Symmetrical without gross deformities. Without edema, no deformity or joint abnormality.  Neurologic:  Alert and  oriented x4;  grossly normal neurologically.  Skin:   Dry and intact without significant lesions or rashes. Psychiatric: Oriented to person, place and time. Demonstrates good judgement and reason without abnormal affect or behaviors.  Physical Exam           RELEVANT LABS AND IMAGING: CBC    Component Value Date/Time   WBC 11.6 (H) 07/26/2023 0349   RBC 4.11 (L) 07/26/2023 0349   HGB 12.7 (L) 07/26/2023 0349   HGB 13.1 11/11/2021 0939   HCT 37.9 (L) 07/26/2023 0349   HCT 37.9 11/11/2021 0939   PLT 267 07/26/2023 0349   PLT 241 11/11/2021 0939   MCV 92.2 07/26/2023 0349   MCV 89 11/11/2021 0939   MCH 30.9 07/26/2023 0349   MCHC 33.5 07/26/2023 0349   RDW 13.9 07/26/2023 0349   RDW 13.1 11/11/2021 0939   LYMPHSABS 2.2 07/26/2023 0349   MONOABS 0.7 07/26/2023 0349   EOSABS 0.1 07/26/2023 0349   BASOSABS 0.0 07/26/2023 0349    CMP     Component Value Date/Time   NA 131 (L) 07/26/2023 0349   NA 135 11/11/2021 0939   K 4.0 07/26/2023 0349   CL 97 (L) 07/26/2023 0349   CO2 26 07/26/2023 0349   GLUCOSE 102 (H) 07/26/2023 0349   BUN 16 07/26/2023 0349   BUN 11 11/11/2021 0939   CREATININE 0.54 (L) 07/26/2023 0349   CALCIUM 8.7 (L) 07/26/2023 0349   PROT 6.7 07/24/2023 0930   ALBUMIN 4.0 07/24/2023 0930   AST 18 07/24/2023 0930   ALT 25 07/24/2023 0930   ALKPHOS 101 07/24/2023 0930   BILITOT 0.8 07/24/2023 0930   GFRNONAA >60 07/26/2023 0349   GFRAA >60  04/19/2011 1902     Assessment/Plan:   83 year old male history of paroxysmal A-fib (on Eliquis) s/p DCCV in 2019, asymptomatic bilaterally  carotid artery atherosclerosis, hypertension, hyperlipidemia, COPD, AAA measuring 4.6 cm, PAD, chronic PVCs, prostate cancer s/p radical prostatectomy 1993, presents for follow up of acute on chronic constipation worsened after amiodarone with CT showing large stool throughout colon and 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  No abdominal pain, nausea, vomiting, or rectal bleeding.  Last colonoscopy 2013 with 3 mm tubular adenoma - Continue MiraLAX twice daily and stool softeners as needed - To continue to increase water, increase fiber, increase exercise -- Follow-up 6 months (or sooner if worsening symptoms)  Mesenteric vascular disease Noted on recent CT scan - Referred to vascular surgery  Colonoscopy Discussed but deferred due to patient's age, comorbidities, and cost considerations. --Continue conservative management of constipation. Reconsider colonoscopy if symptoms worsen or do not improve with new regimen.    Lara Mulch [facility] 09/24/2023, 9:24 AM  Cc: Merri Brunette, MD

## 2023-10-21 ENCOUNTER — Other Ambulatory Visit: Payer: Self-pay | Admitting: Cardiology

## 2023-10-22 NOTE — Telephone Encounter (Signed)
 Prescription refill request for Eliquis received. Indication:AFIB Last office visit:12/24 Scr:0.54  1/25 Age: 83 Weight:111.1  kg  Prescription refilled

## 2023-12-02 ENCOUNTER — Other Ambulatory Visit: Payer: Self-pay | Admitting: Cardiology

## 2023-12-02 DIAGNOSIS — I7121 Aneurysm of the ascending aorta, without rupture: Secondary | ICD-10-CM

## 2023-12-02 DIAGNOSIS — I10 Essential (primary) hypertension: Secondary | ICD-10-CM

## 2023-12-24 ENCOUNTER — Ambulatory Visit
Admission: RE | Admit: 2023-12-24 | Discharge: 2023-12-24 | Disposition: A | Discharge status: Home / Self Care | Source: Ambulatory Visit | Attending: Cardiology | Admitting: Cardiology

## 2023-12-24 DIAGNOSIS — I2510 Atherosclerotic heart disease of native coronary artery without angina pectoris: Secondary | ICD-10-CM | POA: Diagnosis not present

## 2023-12-24 DIAGNOSIS — I7121 Aneurysm of the ascending aorta, without rupture: Secondary | ICD-10-CM | POA: Diagnosis not present

## 2023-12-24 MED ORDER — IOPAMIDOL (ISOVUE-370) INJECTION 76%
75.0000 mL | Freq: Once | INTRAVENOUS | Status: AC | PRN
  Administered 2023-12-24: 75 mL via INTRAVENOUS

## 2023-12-29 ENCOUNTER — Ambulatory Visit: Payer: Self-pay | Admitting: Cardiology

## 2023-12-29 NOTE — Progress Notes (Signed)
 No further follow up testing needed  CT chest for aneurysm follow up 12/29/23: 1. Stable mild fusiform aneurysmal dilation of the ascending thoracic aorta at 4.4 cm. The ascending thoracic aorta has been unchanged in size dating back to February of 2013. Greater than 10 years of stability is highly reassuring and suggests that this aortic diameter may be within normal limits for this particular patient. 2. Stable lung granuloma 3. Coronary and aortic atherosclerosis

## 2024-01-09 ENCOUNTER — Encounter (HOSPITAL_BASED_OUTPATIENT_CLINIC_OR_DEPARTMENT_OTHER): Payer: Self-pay

## 2024-01-11 ENCOUNTER — Encounter: Payer: Self-pay | Admitting: Cardiology

## 2024-01-11 ENCOUNTER — Ambulatory Visit: Payer: Medicare Other | Attending: Cardiology | Admitting: Cardiology

## 2024-01-11 VITALS — BP 118/57 | Resp 16 | Ht 73.0 in | Wt 244.6 lb

## 2024-01-11 DIAGNOSIS — I7121 Aneurysm of the ascending aorta, without rupture: Secondary | ICD-10-CM | POA: Diagnosis not present

## 2024-01-11 DIAGNOSIS — I739 Peripheral vascular disease, unspecified: Secondary | ICD-10-CM

## 2024-01-11 DIAGNOSIS — I480 Paroxysmal atrial fibrillation: Secondary | ICD-10-CM | POA: Diagnosis not present

## 2024-01-11 DIAGNOSIS — I10 Essential (primary) hypertension: Secondary | ICD-10-CM

## 2024-01-11 NOTE — Patient Instructions (Addendum)

## 2024-01-11 NOTE — Progress Notes (Signed)
 Cardiology Office Note:  .   Date:  01/11/2024  ID:  Robert Hensley, DOB [birth year], MRN [id number] PCP: Imelda Man, MD  [facility] Providers Cardiologist:  Knox Perl, MD   History of Present Illness: .   Robert Hensley is a 83 y.o.  Caucasian male with paroxysmal atrial fibrillation S/P DCCV in 2019, mild carotid artery atherosclerosis, hypertension, hyperlipidemia, COPD and prior tobacco use disorder, ascending aortic aneurysm measuring 4.6 cm, no significant coronary disease by angiography on 08/28/2017 moderate PAD with stable claudication.,  chronic PVCs and feels occasional palpitations.  He presents for annual visit.  Patient states that he is completely asymptomatic.  He denies any symptoms of claudication and has been very active physically.  He has varicose veins and has been wearing support stockings regularly as well and has no edema.  Discussed the use of AI scribe software for clinical note transcription with the patient, who gave verbal consent to proceed.  History of Present Illness Robert Hensley is an 83 year old male with atrial fibrillation who presents for cardiovascular follow-up. He manages atrial fibrillation with Eliquis  for anticoagulation and amiodarone  for rhythm control, taking 100 mg of amiodarone  every other day. He maintains a regular heart rhythm without experiencing irregular heartbeats, palpitations, or chest pain.  Hypertension is controlled with amlodipine 10 mg daily, metoprolol  tartrate 25 mg twice daily, and olmesartan  HCT 40/25 mg daily, with stable blood pressure readings.  A 4.5 cm aortic aneurysm has remained stable since 2013, with no change in size or related symptoms. He remains active, volunteering three mornings a week and engaging in gardening and yard work, describing his overall condition as good for his age.  Labs    Lab Results  Component Value Date   NA 131 (L) 07/26/2023   K 4.0 07/26/2023   CO2 26 07/26/2023   GLUCOSE 102  (H) 07/26/2023   BUN 16 07/26/2023   CREATININE 0.54 (L) 07/26/2023   CALCIUM  8.7 (L) 07/26/2023   GFR 100.78 11/01/2011   EGFR 91 11/11/2021   GFRNONAA >60 07/26/2023      Latest Ref Rng & Units 07/26/2023    3:49 AM 07/25/2023    9:54 AM 07/24/2023    9:30 AM  BMP  Glucose 70 - 99 mg/dL 914  782  956   BUN 8 - 23 mg/dL 16  19  23    Creatinine 0.61 - 1.24 mg/dL 2.13  0.86  5.78   Sodium 135 - 145 mmol/L 131  128  132   Potassium 3.5 - 5.1 mmol/L 4.0  4.0  3.9   Chloride 98 - 111 mmol/L 97  93  94   CO2 22 - 32 mmol/L 26  28  29    Calcium  8.9 - 10.3 mg/dL 8.7  8.6  9.3       Latest Ref Rng & Units 07/26/2023    3:49 AM 07/25/2023    9:54 AM 07/24/2023    9:30 AM  CBC  WBC 4.0 - 10.5 K/uL 11.6  12.4  13.0   Hemoglobin 13.0 - 17.0 g/dL 46.9  62.9  52.8   Hematocrit 39.0 - 52.0 % 37.9  39.2  39.3   Platelets 150 - 400 K/uL 267  315  321    No results found for: HGBA1C  Lab Results  Component Value Date   TSH 2.400 09/22/2019    External Labs:  [facility] labs 06/14/2023:  Total cholesterol 07/24/2025, triglycerides 63, HDL 61, LDL 53.  ROS  Review of Systems  Cardiovascular:  Negative for chest pain, claudication, dyspnea on exertion and leg swelling.    Physical Exam:   VS:  BP (!) 118/57 (BP Location: Left Arm, Patient Position: Sitting, Cuff Size: Large)   Resp 16   Ht 6' 1 (1.854 m)   Wt 244 lb 9.6 oz (110.9 kg)   SpO2 98%   BMI 32.27 kg/m    Wt Readings from Last 3 Encounters:  01/11/24 244 lb 9.6 oz (110.9 kg)  09/24/23 245 lb (111.1 kg)  08/10/23 247 lb (112 kg)    Physical Exam Neck:     Vascular: No carotid bruit or JVD.   Cardiovascular:     Rate and Rhythm: Normal rate and regular rhythm.     Pulses: Intact distal pulses.          Dorsalis pedis pulses are 2+ on the right side and 2+ on the left side.       Posterior tibial pulses are 0 on the right side and 0 on the left side.     Heart sounds: Normal heart sounds. No murmur heard.    No gallop.      Comments: Bilateral varicose veins noted Pulmonary:     Effort: Pulmonary effort is normal.     Breath sounds: Normal breath sounds.  Abdominal:     General: Bowel sounds are normal.     Palpations: Abdomen is soft.   Musculoskeletal:     Right lower leg: No edema.     Left lower leg: No edema.    Studies Reviewed: .    CT angiogram chest 12/24/2023 1. Stable mild fusiform aneurysmal dilation of the ascending thoracic aorta at 4.4 cm. The ascending thoracic aorta has been unchanged in size dating back to February of 2013. Greater than 10 years of stability is highly reassuring and suggests that this  aortic diameter may be within normal limits for this particular patient. 2. Aortic and coronary artery atherosclerotic vascular calcifications. 3. Stable appearance of extensive left calcified pleural plaques. 4. Diffuse mild bronchial wall thickening suggesting chronic bronchitis.  ECHOCARDIOGRAM COMPLETE 08/02/2023  1. Left ventricular ejection fraction, by estimation, is 60 to 65%. The left ventricle has normal function. The left ventricle has no regional wall motion abnormalities. There is mild concentric left ventricular hypertrophy. Left ventricular diastolic parameters are consistent with Grade I diastolic dysfunction (impaired relaxation). 2. Right ventricular systolic function is normal. The right ventricular size is normal. There is normal pulmonary artery systolic pressure. The estimated right ventricular systolic pressure is 16.5 mmHg. 3. The mitral valve is normal in structure. Trivial mitral valve regurgitation. No evidence of mitral stenosis. 4. The aortic valve is calcified. There is moderate calcification of the aortic valve. There is moderate thickening of the aortic valve. Aortic valve regurgitation is mild. Aortic valve sclerosis/calcification is present, without any evidence of aortic stenosis. Aortic regurgitation PHT measures 1454 msec. 5. The inferior vena cava is  normal in size with greater than 50% respiratory variability, suggesting right atrial pressure of 3 mmHg. 6. Ascending aorta and aortic root measurements are within normal limits for age when indexed to body surface area. Ao Root diam: 4.00 cm. Ao Asc diam:  3.90 cm  EKG:    EKG Interpretation Date/Time:  Friday January 11 2024 08:14:25 EDT Ventricular Rate:  61 PR Interval:  242 QRS Duration:  88 QT Interval:  400 QTC Calculation: 402 R Axis:   -43  Text Interpretation: EKG 01/11/2024:  Sinus rhythm with first-degree block at rate of 61 bpm, left anterior fascicular block.  Poor R wave progression, probably normal variant however cannot exclude anterior infarct old.  PVCs (2).  Compared to 07/24/2023, no change. Confirmed by Bassam Dresch, Jagadeesh ([id number]) on 01/11/2024 8:16:55 AM    Medications ordered    No orders of the defined types were placed in this encounter.    ASSESSMENT AND PLAN: .      ICD-10-CM   1. Paroxysmal atrial fibrillation (HCC)  I48.0 EKG 12-Lead    2. Primary hypertension  I10     3. Aneurysm of ascending aorta without rupture (HCC)  I71.21     4. Peripheral artery disease (HCC)  I73.9       Assessment and Plan Assessment & Plan Paroxysmal Atrial Fibrillation Paroxysmal atrial fibrillation is well-controlled with amiodarone  and Eliquis . He maintains a regular heart rhythm without symptoms of palpitations or chest pain. Recent CT scan negates the need for a chest x-ray. - Continue amiodarone  50 mg daily (100 mg every other day). - Continue Eliquis .  Aortic Aneurysm, Thoracic Thoracic aortic aneurysm measures 4.5 cm and has been stable since 2013. The risk of rupture is low due to well-controlled blood pressure and age. At 83, surgical risks outweigh benefits. He agrees to discontinue annual scanning. - Discontinue annual scanning for the aneurysm.  Hypertension Hypertension is well-controlled with a stable blood pressure of 118/57 mmHg. Renal function is  good. - Continue amlodipine 10 mg once daily. - Continue metoprolol  tartrate 25 mg twice daily. - Continue olmesartan  HCT 40/25 mg once daily.  Chronic Venous Insufficiency/mild PAD Chronic venous insufficiency is managed with support stockings. He reports no leg cramping or heaviness, and peripheral pulses are good. - Continue wearing support stockings.  D/W Dr. Imelda Man, as patient is only on minimal dose of Amiodarone  200 mg 1/2 tab every other day, Dr, Schuyler Custard is comfortable in taking over the prescription and as patient has remained very stable from cardiac standpoint I will see him back on a as needed basis.  As dictated above he also does not need follow-up surveillance of aortic aneurysm as aneurysm has remained stable since 2013 for almost 12 years now.   Signed,  Knox Perl, MD, [facility] 01/11/2024, 11:27 AM [facility] [address] [place], [place] [address] Phone: [phone]. Fax:  [phone]

## 2024-01-18 DIAGNOSIS — M79642 Pain in left hand: Secondary | ICD-10-CM | POA: Diagnosis not present

## 2024-01-18 DIAGNOSIS — M79643 Pain in unspecified hand: Secondary | ICD-10-CM | POA: Diagnosis not present

## 2024-01-18 DIAGNOSIS — M79644 Pain in right finger(s): Secondary | ICD-10-CM | POA: Diagnosis not present

## 2024-01-18 DIAGNOSIS — Z872 Personal history of diseases of the skin and subcutaneous tissue: Secondary | ICD-10-CM | POA: Diagnosis not present

## 2024-01-18 DIAGNOSIS — M79641 Pain in right hand: Secondary | ICD-10-CM | POA: Diagnosis not present

## 2024-01-18 DIAGNOSIS — M5030 Other cervical disc degeneration, unspecified cervical region: Secondary | ICD-10-CM | POA: Diagnosis not present

## 2024-01-18 DIAGNOSIS — M1990 Unspecified osteoarthritis, unspecified site: Secondary | ICD-10-CM | POA: Diagnosis not present

## 2024-04-23 DIAGNOSIS — H353221 Exudative age-related macular degeneration, left eye, with active choroidal neovascularization: Secondary | ICD-10-CM | POA: Diagnosis not present

## 2024-05-06 ENCOUNTER — Encounter: Payer: Self-pay | Admitting: Gastroenterology

## 2024-05-07 ENCOUNTER — Encounter: Admit: 2024-05-07 | Discharge: 2024-05-07

## 2024-05-16 ENCOUNTER — Other Ambulatory Visit: Payer: Self-pay | Admitting: Cardiology

## 2024-05-16 DIAGNOSIS — I480 Paroxysmal atrial fibrillation: Secondary | ICD-10-CM

## 2024-06-21 ENCOUNTER — Other Ambulatory Visit: Payer: Self-pay | Admitting: Cardiology

## 2024-06-23 NOTE — Telephone Encounter (Signed)
 Prescription refill request for Eliquis  received. Indication:afib Last office visit:6/25 Scr: 0.54  1/25 Age:83 Weight:110.9  kg  Prescription refilled

## 2024-07-24 DEATH — deceased
